# Patient Record
Sex: Male | Born: 1970 | Race: White | Hispanic: No | Marital: Single | Smoking: Never smoker
Health system: Southern US, Community
[De-identification: ages and names within clinical notes are randomized; demographics above are authoritative.]

## PROBLEM LIST (undated history)

## (undated) DIAGNOSIS — M25551 Pain in right hip: Secondary | ICD-10-CM

## (undated) DIAGNOSIS — E119 Type 2 diabetes mellitus without complications: Secondary | ICD-10-CM

## (undated) DIAGNOSIS — Z87442 Personal history of urinary calculi: Secondary | ICD-10-CM

## (undated) DIAGNOSIS — K219 Gastro-esophageal reflux disease without esophagitis: Secondary | ICD-10-CM

## (undated) DIAGNOSIS — G8929 Other chronic pain: Secondary | ICD-10-CM

## (undated) DIAGNOSIS — F419 Anxiety disorder, unspecified: Secondary | ICD-10-CM

## (undated) HISTORY — PX: COLONOSCOPY: SHX174

## (undated) HISTORY — PX: NASAL SINUS SURGERY: SHX719

---

## 2001-03-31 ENCOUNTER — Encounter (INDEPENDENT_AMBULATORY_CARE_PROVIDER_SITE_OTHER): Payer: Self-pay | Admitting: Specialist

## 2001-03-31 ENCOUNTER — Other Ambulatory Visit: Admission: RE | Admit: 2001-03-31 | Discharge: 2001-03-31 | Payer: Self-pay | Admitting: Internal Medicine

## 2001-03-31 ENCOUNTER — Encounter: Payer: Self-pay | Admitting: Internal Medicine

## 2002-09-09 HISTORY — PX: NASAL SINUS SURGERY: SHX719

## 2003-05-28 ENCOUNTER — Encounter: Payer: Self-pay | Admitting: Family Medicine

## 2003-05-28 ENCOUNTER — Ambulatory Visit (HOSPITAL_COMMUNITY): Admission: RE | Admit: 2003-05-28 | Discharge: 2003-05-28 | Payer: Self-pay | Admitting: Family Medicine

## 2003-06-23 ENCOUNTER — Ambulatory Visit (HOSPITAL_BASED_OUTPATIENT_CLINIC_OR_DEPARTMENT_OTHER): Admission: RE | Admit: 2003-06-23 | Discharge: 2003-06-23 | Payer: Self-pay | Admitting: Otolaryngology

## 2003-06-23 ENCOUNTER — Ambulatory Visit (HOSPITAL_COMMUNITY): Admission: RE | Admit: 2003-06-23 | Discharge: 2003-06-23 | Payer: Self-pay | Admitting: Otolaryngology

## 2003-06-23 ENCOUNTER — Encounter (INDEPENDENT_AMBULATORY_CARE_PROVIDER_SITE_OTHER): Payer: Self-pay | Admitting: *Deleted

## 2005-01-16 ENCOUNTER — Ambulatory Visit: Payer: Self-pay | Admitting: Internal Medicine

## 2005-01-29 ENCOUNTER — Ambulatory Visit: Payer: Self-pay | Admitting: Internal Medicine

## 2005-01-29 ENCOUNTER — Encounter (INDEPENDENT_AMBULATORY_CARE_PROVIDER_SITE_OTHER): Payer: Self-pay | Admitting: Specialist

## 2008-09-09 HISTORY — PX: HERNIA REPAIR: SHX51

## 2009-11-16 ENCOUNTER — Ambulatory Visit (HOSPITAL_COMMUNITY): Admission: RE | Admit: 2009-11-16 | Discharge: 2009-11-16 | Payer: Self-pay | Admitting: Surgery

## 2010-10-22 ENCOUNTER — Telehealth: Payer: Self-pay | Admitting: Internal Medicine

## 2010-10-25 ENCOUNTER — Ambulatory Visit (INDEPENDENT_AMBULATORY_CARE_PROVIDER_SITE_OTHER): Payer: 59 | Admitting: Nurse Practitioner

## 2010-10-25 ENCOUNTER — Encounter: Payer: Self-pay | Admitting: Nurse Practitioner

## 2010-10-25 DIAGNOSIS — K625 Hemorrhage of anus and rectum: Secondary | ICD-10-CM | POA: Insufficient documentation

## 2010-10-25 DIAGNOSIS — K5190 Ulcerative colitis, unspecified, without complications: Secondary | ICD-10-CM | POA: Insufficient documentation

## 2010-10-25 DIAGNOSIS — K648 Other hemorrhoids: Secondary | ICD-10-CM | POA: Insufficient documentation

## 2010-10-25 DIAGNOSIS — K5289 Other specified noninfective gastroenteritis and colitis: Secondary | ICD-10-CM

## 2010-10-25 DIAGNOSIS — K644 Residual hemorrhoidal skin tags: Secondary | ICD-10-CM | POA: Insufficient documentation

## 2010-10-25 DIAGNOSIS — K6289 Other specified diseases of anus and rectum: Secondary | ICD-10-CM | POA: Insufficient documentation

## 2010-10-31 NOTE — Progress Notes (Signed)
Summary: Tra[place]: Rectal bleeding/pain   Phone Note Call from Patient Call back at [phone]   Caller: Patient Call For: Dr. Marina Goodell Reason for Call: Talk to Nurse Summary of Call: Pt is having problems with his rectum, having some bleeding, thinks it could be hemorrhoids or his colitis Initial call taken by: [place] Houseworth,  October 22, 2010 11:25 AM  Follow-up for Phone Call        Spoke with patient and he states he is having a little bit of bleeding from his rectum and some discomfort. States he would like to be seen this week. Offered appointment with Willette Cluster, RNP for this week. Patient will see Willette Cluster, RNP 10/25/10 @9am . Follow-up by: Selinda Michaels RN,  October 22, 2010 1:43 PM

## 2010-10-31 NOTE — Procedures (Signed)
Summary: Colon  Patient Name: Jerry Garcia, Jerry Garcia MRN:  Procedure Procedures: Colonoscopy CPT: [phone].    with biopsy. CPT: [id number].  Personnel: Endoscopist: Docia Chuck. Henrene Pastor, MD.  Referred By: Bonne Dolores, MD.  Exam Location: Exam performed in Outpatient Clinic. Outpatient  Patient Consent: Procedure, Alternatives, Risks and Benefits discussed, consent obtained, from patient.  Indications Symptoms: Diarrhea Hematochezia. Abdominal pain / bloating.  Surveillance of: Ulcerative Colitis.  History  Pre-Exam Physical: Performed Mar 31, 2001. Cardio-pulmonary exam, Rectal exam, HEENT exam , Abdominal exam, Extremity exam, Neurological exam, Mental status exam WNL.  Exam Exam: Extent of exam reached: Ileum, extent intended: Ileum.  The cecum was identified by appendiceal orifice and IC valve. Patient position: left side to back. Colon retroflexion performed. Images taken. ASA Classification: II. Tolerance: excellent.  Monitoring: Pulse and BP monitoring, Oximetry used. Supplemental O2 given.  Colon Prep Used Golytely for colon prep. Prep results: excellent.  Sedation Meds: Patient assessed and found to be appropriate for moderate (conscious) sedation. Fentanyl 100 mcg. Versed 10 mg.  Findings NORMAL EXAM: Transverse Colon. Comments: bx'ed.  - MUCOSAL ABNORMALITY: Ascending Colon. Biopsy/Mucosal Abn. taken. Comments: Question mild colitis (focal).  ULCERATIVE COLITIS: Rectum. Granularity present, Proctitis present, taken. 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 Comments: mild.   Assessment Abnormal examination, see findings above.  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   Events  Unplanned Interventions: No intervention was required.  Unplanned Events: There were no complications. Plans Medication Plan: Await pathology.  Comments: Mesalamine 543m supp BID Disposition: After procedure patient sent to recovery. After recovery patient sent home.    Scheduling/Referral: Await pathology to schedule patient. Office Visit, to [employer] PHenrene Pastor MD, in about 4 weeks.,   CC:  MBonne Dolores MD This report was created from the original endoscopy report, which was reviewed and signed by the above listed endoscopist.

## 2010-10-31 NOTE — Procedures (Signed)
Summary: Colon  Patient Name: Jerry Garcia, Jerry Garcia MRN:  Procedure Procedures: Colonoscopy CPT: [phone].    with biopsy. CPT: [id number].  Personnel: Endoscopist: Wilhemina Bonito. Marina Goodell, MD.  Exam Location: Exam performed in Outpatient Clinic. Outpatient  Patient Consent: Procedure, Alternatives, Risks and Benefits discussed, consent obtained, from patient. Consent was obtained by the RN.  Indications Symptoms: Hematochezia.  Surveillance of: Ulcerative Colitis. Last exam: Jul, 2002.  History  Current Medications: Patient is not currently taking Coumadin.  Pre-Exam Physical: Performed Jan 29, 2005. Entire physical exam was normal.  Exam Exam: Extent of exam reached: Terminal Ileum, extent intended: Terminal Ileum.  The cecum was identified by appendiceal orifice and IC valve. Patient position: on left side. Colon retroflexion performed. Images taken. ASA Classification: I. Tolerance: excellent.  Monitoring: Pulse and BP monitoring, Oximetry used. Supplemental O2 given.  Colon Prep Used MIRALAX for colon prep. Prep results: excellent.  Sedation Meds: Patient assessed and found to be appropriate for moderate (conscious) sedation. Fentanyl 150 mcg. given IV. Versed 14 given IV.  Findings NORMAL EXAM: Cecum to Rectum. Biopsy/Normal Exam taken. Comments: BX TRANSVERSE COLON AND RECTUM TAKEN.  MUCOSAL ABNORMALITY: Cecum. Granularity present, Friability: mild. Biopsy/Mucosal Abn. taken. ICD9: Colitis, Unspecified: 558.9. Comments: MILD FOCAL COLITIS ON ICV.  NORMAL EXAM: Ileum.   Assessment  Diagnoses: 558.9: Colitis, Unspecified.   Comments: NO SIGNIFICANT ACTIVE DISEASE Events  Unplanned Interventions: No intervention was required.  Unplanned Events: There were no complications. Plans Medication Plan: 5-ASA: Mesalamine Suppository prn,   Disposition: After procedure patient sent to recovery. After recovery patient sent home.  Scheduling/Referral: Follow-Up prn.   CC:   Loran Senters, MD This report was created from the original endoscopy report, which was reviewed and signed by the above listed endoscopist.

## 2010-10-31 NOTE — Assessment & Plan Note (Addendum)
Summary: Rectal bleeding     History of Present Illness Visit Type: new patient  Primary GI MD: Scarlette Shorts MD Primary Provider: Rosemary Holms, MD  Requesting Provider: na Chief Complaint: Pt c/o BRB when he wipes after BMs and in stools, constipation, and rectal pain   History of Present Illness:   Patient is followed by Dr. Henrene Pastor for history of ulcerative colitis. He was last seen here in 2006 at time of his colonoscopy which revealed only mild focal colitis at ileocecal valve. Folllowing that, patient was advised to follow up with Dr. Henrene Pastor on an as needed basis.  Uses Canasa suppositories on an as need basis which is infrequent.  For last three days he has had some bleeding and pain with defecation. Complains of a bulge in rectum. He has been constipated for three weeks. Started Canasa suppositories 3 days ago. This doesn't really feel like a flare, he has no abdominal pain.   GI Review of Systems    Reports acid reflux and  bloating.      Denies abdominal pain, belching, chest pain, dysphagia with liquids, dysphagia with solids, heartburn, loss of appetite, nausea, vomiting, vomiting blood, weight loss, and  weight gain.      Reports constipation, hemorrhoids, rectal bleeding, and  rectal pain.     Denies anal fissure, black tarry stools, change in bowel habit, diarrhea, diverticulosis, fecal incontinence, heme positive stool, irritable bowel syndrome, jaundice, light color stool, and  liver problems.   Current Medications (verified): 1)  Canasa 1000 Mg Supp (Mesalamine) .Marland Kitchen.. 1 Supp in Rectum At Bedtime  Allergies (verified): 1)  ! Percocet  Past History:  Past Medical History: Ulcerative colitis  Past Surgical History: Hernia Surgery Sinus Surgery   Family History: No FH of Colon Cancer:  Social History: Mail Clerk  Single No childern Patient has never smoked.  Alcohol Use - no Daily Caffeine Use: 2 daily  Smoking Status:  never  Review of Systems  The patient  denies allergy/sinus, anemia, anxiety-new, arthritis/joint pain, back pain, blood in urine, breast changes/lumps, change in vision, confusion, cough, coughing up blood, depression-new, fainting, fatigue, fever, headaches-new, hearing problems, heart murmur, heart rhythm changes, itching, menstrual pain, muscle pains/cramps, night sweats, nosebleeds, pregnancy symptoms, shortness of breath, skin rash, sleeping problems, sore throat, swelling of feet/legs, swollen lymph glands, thirst - excessive , urination - excessive , urination changes/pain, urine leakage, vision changes, and voice change.    Vital Signs:  Patient profile:   40 year old male Height:      70 inches Weight:      210 pounds BMI:     30.24 BSA:     2.13 Temp:     98.6 degrees F oral Pulse rate:   88 / minute Pulse rhythm:   regular BP sitting:   132 / 74  (left arm) Cuff size:   regular  Vitals Entered By: Hope Pigeon CMA (October 25, 2010 8:45 AM)  Physical Exam  General:  Well developed, well nourished, no acute distress. Head:  Normocephalic and atraumatic. Eyes:  Conjunctiva pink, no icterus.  Neck:  no obvious masses  Lungs:  Clear throughout to auscultation. Heart:  Regular rate and rhythm; no murmurs, rubs,  or bruits. Abdomen:  Abdomen soft, nontender, nondistended. No obvious masses or hepatomegaly.Normal bowel sounds.  Rectal:  Questionable small  posterior midline fissure (not really tender)Small inflamed internal hemorrhoids on anoscopy. Stool light brown, heme negative.  Msk:  Symmetrical with no gross deformities. Normal posture.  Extremities:  No palmar erythema, no edema.  Neurologic:  Alert and  oriented x4;  grossly normal neurologically. Skin:  Intact without significant lesions or rashes. Cervical Nodes:  No significant cervical adenopathy. Psych:  Alert and cooperative. Normal mood and affect.  Impression & Recommendations:  Problem # 1:  RECTAL BLEEDING (ICD-569.3) Assessment New Cannot  exclude UC flare but I favor symptoms secondary to small fissure and/ or internal hemorroids, especially given recent constipation. Stop Canasa suppositories and start steroid suppositories. Will start him on daily fiber. Patient will call us in 7-10 days with condition update. Further recommendations based on how he does.   Problem # 2:  COLITIS (ICD-558.9) Assessment: Comment Only History of ulcerative colitis 2002 colonoscopy remarkable for ascending colon colitis and proctitis. His last colonoscopy in 2006 however,  showed only mild focal colitis at ileocecal valve (biopsies compatible with mild active chronic mucosal colitis).  Patient doesn't take any maintainence medications, just Canasa suppositories as needed.  Patient Instructions: 1)  We have given you samples of Metamucil fiber to take daily. 2)  Start Miralax daily, 17 grams in a glass of water or juice. 3)  Discontinue the Canasa suppositories for now. 4)  Call us in 1 week if you are not better.  Ask for Pam at [phone].  5)  Copy sent to : Dr. Rosemary Holms 6)  The medication list was reviewed and reconciled.  All changed / newly prescribed medications were explained.  A complete medication list was provided to the patient / caregiver.  Prescriptions: HYDROCORTISONE ACETATE 25 MG SUPP (HYDROCORTISONE ACETATE) Use 1 suppository rectally for hemorrhoids  #7 x 0   Entered by:   Marisue Humble NCMA   Authorized by:   Tye Savoy NP   Signed by:   Marisue Humble NCMA on 10/25/2010   Method used:   Print then Give to Patient   RxID:   [id number]

## 2011-01-25 NOTE — H&P (Signed)
NAME:  Jerry Garcia, COIA                        ACCOUNT NO.:  [id number]   MEDICAL RECORD NO.:  [id number]                   PATIENT TYPE:  AMB   LOCATION:  [place]                                  FACILITY:  [facility]   PHYSICIAN:  Minna Merritts, M.D.                DATE OF BIRTH:  February 11, 1971   DATE OF ADMISSION:  06/23/2003  DATE OF DISCHARGE:                                HISTORY & PHYSICAL   HISTORY OF PRESENT ILLNESS:  This patient is a 40 year old male who has a  history of nasal and septal deviation.  He has had difficulty breathing  through his nose on several occasions.  Has had also sinusitis problems and  has been on Pro lax D, using Augmentin, Z-pack, and doxycycline in the past  and has used nasal sprays.  I was very concerned about his sinuses, was  concerning the number of sinus infections he has and we got a CAT scan that  showed only some acute activity of his left maxillary sinus but it showed a  septal deviation.  He now enters for a septal reconstruction to see if we  can improve his airway and also improve his record for sinus infections.   ALLERGIES:  NO KNOWN DRUG ALLERGIES   MEDICATIONS:  He takes no medications, except as mentioned.   PAST MEDICAL HISTORY:  1. Colitis.  2. Reflux esophagitis and takes Nexium 40 mg p.r.n.   PAST SURGICAL HISTORY:  He has never had any other surgeries.   PHYSICAL EXAMINATION:  VITAL SIGNS:  Blood pressure of 123/82, weight 193,  height 5 feet 10 inches.  HEENT:  His ears are clear, oral cavity is clear.  His septum is deviated to  the right with a spur to the right just off the premaxillary crest to the  left.  His columella is pushed way over to the right, blocking the right  nasal airway, essentially about 80%.  The larynx is clear.  True cords,  false cords, epiglottis, base of tongue are clear of any ulceration or mass.  NECK:  His neck is free of any thyromegaly, cervical adenopathy or mass.  CHEST:  Clear, no wheezing,  rhonchi or rales.  CARDIOVASCULAR:  No mixed sounds, murmurs or gallops.  ABDOMEN:  Free of any organomegaly, tenderness or mass.  EXTREMITIES:  Unremarkable.    </INITIAL DIAGNOSES>  1. 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  2. History of reflux esophagitis.  3. Colitis.                                                Minna Merritts, M.D.    JC/MEDQ  D:  06/23/2003  T:  06/23/2003  Job:  [id number]   cc:   Margaretmary Eddy, M.D.  [place] [address]  Fax: [phone]

## 2011-01-25 NOTE — Op Note (Signed)
NAME:  Jerry Garcia, Jerry Garcia                        ACCOUNT NO.:  [id number]   MEDICAL RECORD NO.:  [id number]                   PATIENT TYPE:  AMB   LOCATION:  [place]                                  FACILITY:  [facility]   PHYSICIAN:  Minna Merritts, M.D.                DATE OF BIRTH:  [birth year]   DATE OF PROCEDURE:  06/23/2003  DATE OF DISCHARGE:                                 OPERATIVE REPORT   PREOPERATIVE DIAGNOSIS:  Septal deviation, turbinate hypertrophy with nasal  obstruction with history of sinusitis.   POSTOPERATIVE DIAGNOSIS:  Septal deviation, turbinate hypertrophy with nasal  obstruction with history of sinusitis.   OPERATION PERFORMED:  Septal reconstruction, turbinate reduction.   SURGEON:  Minna Merritts, M.D.   ANESTHESIA:  Local 1% Xylocaine with epinephrine 53m with topical cocaine  200 mg.   DESCRIPTION OF PROCEDURE:  The patient was placed in supine position, under  local anesthesia was prepped and draped in the appropriate manner using  Hibiclens times three and the usual head drape and then the patient was  anesthetized with the above mentioned medication.  A hemitransfixion  incision was made on the left side, carried around the columella, pushing  the columella to the left and working on the right side.  We worked back  along the quadrilateral cartilage to the posterior quadrilateral cartilage  where we took a strip of cartilage there. We took a strip of cartilage from  the floor of the nose as it was grossly off the premaxillary crest and then  we approached the ethmoid septal deviation which was pushed also over toward  the right.  We corrected this by using the open and closed JRanelle Oyster  Middletons, also a large spur was removed from the vomerine region and this  was done with the [facility] Centerforceps.  We then made an incision on the floor  of the nose at the left side and pulled a portion of this quadrilateral  cartilage that was off the premaxillary crest.   Once this was achieved, the  septum was established in the midline.  We had to mobilize around the  columella to get it to come over to the midline.  We outfractured the  inferior turbinates to get as much space as possible and then we closed this  with 5-0 plain cat.  We then used through-and-through septal suture using 4-  0 plain times two and the intranasal dressing was placed using Merocel  packs.  Patient is aware that he will not travel or do heavy lifting for  approximately 10 days.  He has to be very careful at home about any exercise  causing bleeding and he will be returning to my office tomorrow and follow-  up will be in 10 days and then three weeks and six weeks.  Minna Merritts, M.D.    JC/MEDQ  D:  06/23/2003  T:  06/23/2003  Job:  [id number]   cc:   Margaretmary Eddy, M.D.  [address] [place] [address]  Fax: [phone]

## 2011-06-05 ENCOUNTER — Telehealth: Payer: Self-pay | Admitting: Internal Medicine

## 2011-06-05 MED ORDER — HYDROCORTISONE 2.5 % RE CREA: TOPICAL_CREAM | RECTAL | Status: DC

## 2011-06-05 NOTE — Telephone Encounter (Signed)
Proctocream HC 2.5% apply 2-3 times a day #30 grams and no refill  He will need an office follow-up by October to make sure nothing else going on given hx ?proctitis, etc

## 2011-06-05 NOTE — Telephone Encounter (Signed)
Pt states that he has an external hemorrhoid about the size of a grape that has been bothering him since Sunday. He states that he has been taking miralax and metamucil and soaking BID, he states that there was a little bleeding from it yesterday. Pt is requesting something for the hemorrhoid. Dr. Carlean Purl as doc of the day please advise.

## 2011-06-05 NOTE — Telephone Encounter (Signed)
Pt aware and script sent to pharmacy. Scheduled pt to see Dr. Marina Goodell 07/01/11@8 :30am. Pt aware of appt date and time.

## 2011-07-01 ENCOUNTER — Ambulatory Visit (INDEPENDENT_AMBULATORY_CARE_PROVIDER_SITE_OTHER): Payer: 59 | Admitting: Internal Medicine

## 2011-07-01 ENCOUNTER — Encounter: Payer: Self-pay | Admitting: Internal Medicine

## 2011-07-01 VITALS — BP 124/86 | HR 72 | Ht 70.0 in | Wt 214.2 lb

## 2011-07-01 DIAGNOSIS — K645 Perianal venous thrombosis: Secondary | ICD-10-CM

## 2011-07-01 DIAGNOSIS — K529 Noninfective gastroenteritis and colitis, unspecified: Secondary | ICD-10-CM

## 2011-07-01 DIAGNOSIS — K5289 Other specified noninfective gastroenteritis and colitis: Secondary | ICD-10-CM

## 2011-07-01 NOTE — Progress Notes (Signed)
HISTORY OF PRESENT ILLNESS:  Jerry Garcia is a 40 y.o. male with a history of mild intermittent ascending colitis and proctitis. I have not seen him since 2006 when he underwent complete colonoscopy for rectal bleeding. He was found to have minimal focal colitis on the ileocecal valve. Otherwise normal exam. Normal ileum. He recently contacted the office complaining of a probable hemorrhoid. He describes a protrusion the size of a grape. This was tender with minimal bleeding. This was treated with sitz baths and hydrocortisone cream. He has had significant improvement. Otherwise, no GI complaints. Specifically no change in bowel habits, blood in his stool, abdominal pain, or weight loss. General medical health is stable.  REVIEW OF SYSTEMS:  All non-GI ROS negative.   Past Medical History  Diagnosis Date  . Ulcerative colitis     Past Surgical History  Procedure Date  . Hernia repair   . Nasal sinus surgery     Social History Jerry Garcia  reports that he has never smoked. He has never used smokeless tobacco. He reports that he does not drink alcohol or use illicit drugs.  family history is negative for Colon cancer.  Allergies  Allergen Reactions  . Oxycodone-Acetaminophen        PHYSICAL EXAMINATION: Vital signs: BP 124/86  Pulse 72  Ht 5' 10"  (1.778 m)  Wt 214 lb 3.2 oz (97.16 kg)  BMI 30.73 kg/m2  Constitutional: generally well-appearing, no acute distress Psychiatric: alert and oriented x3, cooperative Eyes: extraocular movements intact, anicteric, conjunctiva pink Cardiovascular: heart regular rate and rhythm,  Abdomen: soft, Rectal: Left semi-thrombosed hemorrhoid (improving) Extremities: no lower extremity edema bilaterally Skin: no lesions on visible extremities   ASSESSMENT:  #1. Thrombosed hemorrhoid. Improving #2. Minimal A. ascending colitis and proctitis, intermittently. Currently asymptomatic. Previous colonoscopies in 2002 and again in  2006   PLAN:  #1. Continue fiber #2. Continue hydrocortisone cream until completely better #3. Continue sitz baths and to completely better #4. GI followup when necessary. Routine colonoscopy at age 74

## 2011-07-01 NOTE — Patient Instructions (Signed)
Follow up as needed

## 2012-08-12 ENCOUNTER — Telehealth: Payer: Self-pay | Admitting: Internal Medicine

## 2012-08-12 MED ORDER — MESALAMINE 1000 MG RE SUPP: 1000.0000 mg | Freq: Every day | RECTAL | Status: DC

## 2012-08-12 NOTE — Telephone Encounter (Signed)
Told patient I had sent his Canasa refill to his pharmacy and would leave one box of samples up front for him; patient acknowledged and agreed

## 2013-01-29 ENCOUNTER — Encounter (HOSPITAL_COMMUNITY): Payer: Self-pay | Admitting: Emergency Medicine

## 2013-01-29 ENCOUNTER — Emergency Department (INDEPENDENT_AMBULATORY_CARE_PROVIDER_SITE_OTHER)
Admission: EM | Admit: 2013-01-29 | Discharge: 2013-01-29 | Disposition: A | Discharge status: Home / Self Care | Payer: 59 | Source: Home / Self Care

## 2013-01-29 DIAGNOSIS — M79672 Pain in left foot: Secondary | ICD-10-CM

## 2013-01-29 DIAGNOSIS — M79609 Pain in unspecified limb: Secondary | ICD-10-CM

## 2013-01-29 DIAGNOSIS — R071 Chest pain on breathing: Secondary | ICD-10-CM

## 2013-01-29 DIAGNOSIS — R0789 Other chest pain: Secondary | ICD-10-CM

## 2013-01-29 NOTE — ED Notes (Signed)
Pt c/o chest discomfort onset 3 weeks... Reports pain on right side of chest when raises arms Denies: SOB, HA, edema, inj/trauma... Does report he lifts boxes at work that weigh 60-70lbs Also c/o toes burning on bilateral feet onset 1 week Denies: blurry vision, urinary freq, but has the urgency  He is alert and oriented w/no signs of acute distress.

## 2013-01-29 NOTE — ED Provider Notes (Signed)
Medical screening examination/treatment/procedure(s) were performed by non-physician practitioner and as supervising physician I was immediately available for consultation/collaboration.  Leslee Home, M.D.  Reuben Likes, MD 01/29/13 2031

## 2013-01-29 NOTE — ED Provider Notes (Signed)
History     CSN: [id number]  Arrival date & time 01/29/13  1731   None     Chief Complaint  Patient presents with  . Chest Pain    (Consider location/radiation/quality/duration/timing/severity/associated sxs/prior treatment) HPI Comments: Patient also complains of bilateral burning sensation of the toes. This began approximately 2 weeks ago and had been intermittent until approximately 2 days ago when the burning has worsened. There is no associated trauma. Has no history of known metabolic disorder may calls neuropathic type pain. He wears steel toed boots with prolonged standing. Recently he has tried changing shoes. He takes his shoes off the burning pain tends to disappear. Applying any shoe the burning returns.  Patient is a 42 y.o. male presenting with chest pain. The history is provided by the patient.  Chest Pain Pain location:  R chest and R lateral chest Pain quality: aching and shooting   Pain radiates to:  Does not radiate Pain radiates to the back: no   Pain severity:  Mild Onset quality:  Sudden Duration:  21 days Timing:  Intermittent Progression:  Partially resolved Chronicity:  New Context: breathing, lifting, movement, raising an arm and at rest   Context: no trauma   Relieved by:  Rest Worsened by:  Exertion, movement, deep breathing and certain positions Associated symptoms: no abdominal pain, no altered mental status, no anxiety, no back pain, no cough, no dizziness, no dysphagia, no fever, no heartburn, no lower extremity edema, no nausea, no near-syncope, no orthopnea, no palpitations, no shortness of breath, no syncope, not vomiting and no weakness   Risk factors: male sex   Risk factors: no diabetes mellitus     Past Medical History  Diagnosis Date  . Ulcerative colitis     Past Surgical History  Procedure Laterality Date  . Hernia repair    . Nasal sinus surgery      Family History  Problem Relation Age of Onset  . Colon cancer Neg Hx      History  Substance Use Topics  . Smoking status: Never Smoker   . Smokeless tobacco: Never Used  . Alcohol Use: No      Review of Systems  Constitutional: Negative for fever.  HENT: Negative.  Negative for trouble swallowing.   Respiratory: Negative for cough and shortness of breath.   Cardiovascular: Positive for chest pain. Negative for palpitations, orthopnea, leg swelling, syncope and near-syncope.  Gastrointestinal: Negative for heartburn, nausea, vomiting and abdominal pain.  Genitourinary: Negative.   Musculoskeletal: Negative for back pain.  Neurological: Negative for dizziness and weakness.  Psychiatric/Behavioral: Negative for altered mental status.    Allergies  Oxycodone-acetaminophen  Home Medications   Current Outpatient Rx  Name  Route  Sig  Dispense  Refill  . hydrocortisone (ANUSOL-HC) 2.5 % rectal cream      Apply to rectum 2-3 times per day.   30 g   0   . mesalamine (CANASA) 1000 MG suppository   Rectal   Place 1 suppository (1,000 mg total) rectally at bedtime.   30 suppository   2     BP 133/86  Pulse 100  Temp(Src) 99.2 F (37.3 C) (Oral)  Resp 16  SpO2 100%  Physical Exam  Nursing note and vitals reviewed. Constitutional: He is oriented to person, place, and time. He appears well-developed and well-nourished. No distress.  HENT:  Head: Normocephalic and atraumatic.  Eyes: EOM are normal. Left eye exhibits no discharge.  Neck: Normal range of motion. Neck supple.  Cardiovascular: Normal rate, regular rhythm and normal heart sounds.   Pulmonary/Chest: Effort normal and breath sounds normal. No respiratory distress. He has no wheezes. He exhibits tenderness.  Musculoskeletal: He exhibits tenderness.  Neurological: He is alert and oriented to person, place, and time. No cranial nerve deficit.  Skin: Skin is warm and dry.  Psychiatric: He has a normal mood and affect.    ED Course  Procedures (including critical care  time)  Labs Reviewed - No data to display No results found.   1. Right-sided chest wall pain   2. Foot pain, bilateral       MDM  Continued the ibuprofen when necessary pain. Recommend putting ice over the area of soreness. The burning toes are most likely due to his footwear with steel toed boots and prolonged standing. Less likely this is a neuropathy due to a metabolic disorder. Recommend wearing a more air he shoe, lie in plenty of forefoot width. He Followup with primary care doctor as needed. For any new symptoms problems or worsening may return. Patient is discharged in stable condition.  Hayden Rasmussen, NP 01/29/13 1858

## 2013-04-15 ENCOUNTER — Telehealth: Payer: Self-pay | Admitting: Internal Medicine

## 2013-04-25 NOTE — Telephone Encounter (Signed)
Please get some clinical history. Thanks

## 2013-04-26 NOTE — Telephone Encounter (Signed)
Pt states that he has been having some rectal pain and a little bit of bleeding. States he has used some of his canasa supp and it was feeling better until he worked outside yesterday. States he is having more pain today. Pt thinks he should be seen. Offered pt an appt for today but he could not come today. Requested an appt for Wed. Pt scheduled to see Doug Sou PA 04/28/13@11am . Pt aware of appt.

## 2013-04-27 ENCOUNTER — Telehealth: Payer: Self-pay

## 2013-04-27 MED ORDER — HYDROCORTISONE 2.5 % RE CREA: TOPICAL_CREAM | RECTAL | Status: DC

## 2013-04-27 NOTE — Telephone Encounter (Signed)
Refilled Anusol cream

## 2013-04-28 ENCOUNTER — Other Ambulatory Visit (INDEPENDENT_AMBULATORY_CARE_PROVIDER_SITE_OTHER): Payer: 59

## 2013-04-28 ENCOUNTER — Encounter: Payer: Self-pay | Admitting: Gastroenterology

## 2013-04-28 ENCOUNTER — Ambulatory Visit (INDEPENDENT_AMBULATORY_CARE_PROVIDER_SITE_OTHER): Payer: 59 | Admitting: Gastroenterology

## 2013-04-28 ENCOUNTER — Ambulatory Visit (INDEPENDENT_AMBULATORY_CARE_PROVIDER_SITE_OTHER)
Admission: RE | Admit: 2013-04-28 | Discharge: 2013-04-28 | Disposition: A | Discharge status: Home / Self Care | Payer: 59 | Source: Ambulatory Visit | Attending: Gastroenterology | Admitting: Gastroenterology

## 2013-04-28 VITALS — BP 122/80 | HR 97 | Ht 70.0 in | Wt 200.6 lb

## 2013-04-28 DIAGNOSIS — G8929 Other chronic pain: Secondary | ICD-10-CM

## 2013-04-28 DIAGNOSIS — Z8719 Personal history of other diseases of the digestive system: Secondary | ICD-10-CM

## 2013-04-28 DIAGNOSIS — R1031 Right lower quadrant pain: Secondary | ICD-10-CM

## 2013-04-28 DIAGNOSIS — K625 Hemorrhage of anus and rectum: Secondary | ICD-10-CM

## 2013-04-28 LAB — CBC WITH DIFFERENTIAL/PLATELET
Basophils Absolute: 0 10*3/uL (ref 0.0–0.1)
Eosinophils Absolute: 0.2 10*3/uL (ref 0.0–0.7)
Eosinophils Relative: 2.2 % (ref 0.0–5.0)
HCT: 44.5 % (ref 39.0–52.0)
Lymphs Abs: 1.3 10*3/uL (ref 0.7–4.0)
MCHC: 34.3 g/dL (ref 30.0–36.0)
MCV: 86.8 fl (ref 78.0–100.0)
Monocytes Absolute: 0.5 10*3/uL (ref 0.1–1.0)
Neutrophils Relative %: 71.7 % (ref 43.0–77.0)
Platelets: 361 10*3/uL (ref 150.0–400.0)
RDW: 13.7 % (ref 11.5–14.6)

## 2013-04-28 LAB — COMPREHENSIVE METABOLIC PANEL
AST: 23 U/L (ref 0–37)
Alkaline Phosphatase: 61 U/L (ref 39–117)
Glucose, Bld: 99 mg/dL (ref 70–99)
Potassium: 4.2 mEq/L (ref 3.5–5.1)
Sodium: 135 mEq/L (ref 135–145)
Total Bilirubin: 0.6 mg/dL (ref 0.3–1.2)
Total Protein: 7.8 g/dL (ref 6.0–8.3)

## 2013-04-28 LAB — HIGH SENSITIVITY CRP: CRP, High Sensitivity: 3.66 mg/L (ref 0.000–5.000)

## 2013-04-28 MED ORDER — IOHEXOL 300 MG/ML  SOLN
100.0000 mL | Freq: Once | INTRAMUSCULAR | Status: AC | PRN
  Administered 2013-04-28: 100 mL via INTRAVENOUS

## 2013-04-28 NOTE — Progress Notes (Signed)
Case discussed with physician assistant. Agree with initial assessment and plans as outlined.

## 2013-04-28 NOTE — Patient Instructions (Addendum)
Please go to the basement level to have your labs drawn.   You have been scheduled for a CT scan of the abdomen and pelvis at [facility] ([address]---this is in the same building as [facility]).   You are scheduled on 04-28-2013 Wednesday. You should arrive at 3:45 PM  prior to your appointment time for registration. Please follow the written instructions below on the day of your exam:  WARNING: IF YOU ARE ALLERGIC TO IODINE/X-RAY DYE, PLEASE NOTIFY RADIOLOGY IMMEDIATELY AT [phone]! YOU WILL BE GIVEN A 13 HOUR PREMEDICATION PREP.  1) Do not eat or drink anything after 11:00 am  (4 hours prior to your test) 2) You have been given 2 bottles of oral contrast to drink. The solution may taste better if refrigerated, but do NOT add ice or any other liquid to this solution. Shake well before drinking.    Drink 1 bottle of contrast @ 2:00 (2 hours prior to your exam)  Drink 1 bottle of contrast @ 3:00 PM  (1 hour prior to your exam)  You may take any medications as prescribed with a small amount of water except for the following: Metformin, Glucophage, Glucovance, Avandamet, Riomet, Fortamet, Actoplus Met, Janumet, Glumetza or Metaglip. The above medications must be held the day of the exam AND 48 hours after the exam.  The purpose of you drinking the oral contrast is to aid in the visualization of your intestinal tract. The contrast solution may cause some diarrhea. Before your exam is started, you will be given a small amount of fluid to drink. Depending on your individual set of symptoms, you may also receive an intravenous injection of x-ray contrast/dye. Plan on being at [facility] for 30 minutes or long, depending on the type of exam you are having performed.  If you have any questions regarding your exam or if you need to reschedule, you may call the CT department at [phone] between the hours of 8:00 am and 5:00 pm,  Monday-Friday.  ________________________________________________________________________

## 2013-04-28 NOTE — Progress Notes (Signed)
04/28/2013 Jerry Garcia [id number] 10-Nov-1970   History of Present Illness:  Patient is a 42 year old male who is a patient of Dr. Lamar Sprinkles.  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  He has never wanted to take maintenance medication for this, however, and only uses Canasa suppositories prn.  Last colonoscopy in 2006 actually showed minimal focal colitis at the [facility].    Anyway, he comes in today with complaints of abdominal pain and pelvic discomfort along with pain in his lower back over the past month or so.  Also change in bowels with alternating constipation and diarrhea.  Takes Metamucil every day.  Also, said that he had a deep pulling sensation in his rectum as well with a small amount of bleeding.  He started using the Canasa suppositories again and the rectal complaints have improved, but the other symptoms persist.  He denies nausea, vomiting, fevers, chills, decreased appetite, and weight loss.   Current Medications, Allergies, Past Medical History, Past Surgical History, Family History and Social History were reviewed in [employer] record.   Physical Exam: BP 122/80  Pulse 97  Ht 5\' 10"  (1.778 m)  Wt 200 lb 9.6 oz (90.992 kg)  BMI 28.78 kg/m2  SpO2 98% General: Well developed, white male in no acute distress; appears somewhat anxious. Head: Normocephalic and atraumatic Eyes:  sclerae anicteric, conjunctiva pink  Ears: Normal auditory acuity Lungs: Clear throughout to auscultation Heart: Regular rate and rhythm Abdomen: Soft, non-distended. No masses, no hepatomegaly. Normal bowel sounds.  Mild lower abdominal/pelvic TTP without R/R/G. Rectal: Small external hemorrhoid noted.  No fissure.  DRE did not reveal any masses or tenderness. Musculoskeletal: Symmetrical with no gross deformities  Extremities: No edema  Neurological: Alert oriented x 4, grossly  nonfocal Psychological:  Alert and cooperative. Normal mood and affect; slightly anxious.  Assessment and Recommendations: -42 year old male with past history of non-specific colitis without any chronic treatment regimen now presenting with abdominal pains and rectal pains along with pain in his back/SI joints.  Also with rectal bleeding and some change in bowel habits.  I discussed this patient with Dr. Marina Goodell who agrees to imaging with CT scan of the abdomen and pelvis with contrast along with repeat colonoscopy.  In the interim, continue Canasa suppositories and can used NSAID's intermittently for back pain.  Will check CBC, CMP, sed rate and CRP as well.  The risks, benefits, and alternatives were discussed with the patient and he consents to proceed.

## 2013-04-29 ENCOUNTER — Other Ambulatory Visit: Payer: Self-pay | Admitting: Gastroenterology

## 2013-04-29 MED ORDER — PEG-KCL-NACL-NASULF-NA ASC-C 100 G PO SOLR: 1.0000 | Freq: Once | ORAL | Status: DC

## 2013-04-30 ENCOUNTER — Other Ambulatory Visit: Payer: 59

## 2013-04-30 ENCOUNTER — Encounter: Payer: Self-pay | Admitting: Internal Medicine

## 2013-04-30 ENCOUNTER — Ambulatory Visit (AMBULATORY_SURGERY_CENTER): Payer: 59 | Admitting: Internal Medicine

## 2013-04-30 VITALS — BP 116/69 | HR 83 | Temp 98.0°F | Resp 20 | Ht 70.0 in | Wt 200.0 lb

## 2013-04-30 DIAGNOSIS — R1031 Right lower quadrant pain: Secondary | ICD-10-CM

## 2013-04-30 DIAGNOSIS — K5190 Ulcerative colitis, unspecified, without complications: Secondary | ICD-10-CM

## 2013-04-30 DIAGNOSIS — K625 Hemorrhage of anus and rectum: Secondary | ICD-10-CM

## 2013-04-30 DIAGNOSIS — Z1211 Encounter for screening for malignant neoplasm of colon: Secondary | ICD-10-CM

## 2013-04-30 DIAGNOSIS — G8929 Other chronic pain: Secondary | ICD-10-CM

## 2013-04-30 HISTORY — PX: COLONOSCOPY: SHX174

## 2013-04-30 MED ORDER — SODIUM CHLORIDE 0.9 % IV SOLN: 500.0000 mL | INTRAVENOUS | Status: DC

## 2013-04-30 NOTE — Patient Instructions (Addendum)

## 2013-04-30 NOTE — Op Note (Signed)
[facility] [address] [place], [address]   COLONOSCOPY PROCEDURE REPORT  PATIENT: Jerry, Garcia  MR#: [id number] BIRTHDATE: 09/20/1970 , 42  yrs. old GENDER: Male ENDOSCOPIST: Roxy Cedar, MD REFERRED BY:.  Self / Office PROCEDURE DATE:  04/30/2013 PROCEDURE:   Colonoscopy, screening First Screening Colonoscopy - Avg.  risk and is 50 yrs.  old or older - No.  Prior Negative Screening - Now for repeat screening. N/A  History of Adenoma - Now for follow-up colonoscopy & has been > or = to 3 yrs.  N/A  Polyps Removed Today? No.  Recommend repeat exam, <10 yrs? No. ASA CLASS:   Class II INDICATIONS:High risk patient with previously diagnosed UC. Intermittent proctitis and patchy ascending colitis since '92. Last exam 2006. Some symptoms at recent OV as described. CT - ok MEDICATIONS: MAC sedation, administered by CRNA and propofol (Diprivan) 250mg  IV  DESCRIPTION OF PROCEDURE:   After the risks benefits and alternatives of the procedure were thoroughly explained, informed consent was obtained.  A digital rectal exam revealed no abnormalities of the rectum.   The LB [id number]  endoscope was introduced through the anus and advanced to the cecum, which was identified by both the appendix and ileocecal valve. No adverse events experienced.   The quality of the prep was excellent, using MoviPrep  The instrument was then slowly withdrawn as the colon was fully examined.  COLON FINDINGS: A normal appearing cecum, ileocecal valve, and appendiceal orifice were identified.  The ascending, hepatic flexure, transverse, splenic flexure, descending, sigmoid colon and rectum appeared unremarkable.  No polyps or cancers were seen. Retroflexed views revealed internal hemorrhoids. The time to cecum=2 minutes 19 seconds.  Withdrawal time=6 minutes 56 seconds. The scope was withdrawn and the procedure completed.  COMPLICATIONS: There were no  complications.  ENDOSCOPIC IMPRESSION: 1. Normal colon 2, Quiescent UC  RECOMMENDATIONS: 1. Continue Canasa suppositories as needed 2. Repeat colonoscopy in 10 years.   eSigned:  Roxy Cedar, MD 04/30/2013 3:04 PM   cc: Simone Curia, Md and The Patient   PATIENT NAME:  Jerry, Garcia MR#: [id number]

## 2013-04-30 NOTE — Progress Notes (Signed)
Patient did not experience any of the following events: a burn prior to discharge; a fall within the facility; wrong site/side/patient/procedure/implant event; or a hospital transfer or hospital admission upon discharge from the facility. (G8907) Patient did not have preoperative order for IV antibiotic SSI prophylaxis. (G8918)  

## 2013-04-30 NOTE — Progress Notes (Signed)
Report to pacu RN, vss, bbs=clear 

## 2013-05-03 ENCOUNTER — Telehealth: Payer: Self-pay

## 2013-05-03 NOTE — Telephone Encounter (Signed)
  Follow up Call-  Call back number 04/30/2013  Post procedure Call Back phone  # [phone]  Permission to leave phone message Yes     Patient questions:  Do you have a fever, pain , or abdominal swelling? no Pain Score  0 *  Have you tolerated food without any problems? yes  Have you been able to return to your normal activities? yes  Do you have any questions about your discharge instructions: Diet   no Medications  no Follow up visit  no  Do you have questions or concerns about your Care? no  Actions: * If pain score is 4 or above: No action needed, pain <4.   No problems per the pt . Maw

## 2013-05-04 ENCOUNTER — Telehealth: Payer: Self-pay | Admitting: Family Medicine

## 2013-05-04 NOTE — Telephone Encounter (Signed)
Patient says that Jerry Garcia was supposed to send over his results for colonoscopy and cat scan and he states they seen something wrong with his cat scan. He is wondering if he needs to make an appointment at our office or does he need to be referred somewhere else?

## 2013-05-04 NOTE — Telephone Encounter (Signed)
Scan showed issue with femur, he can follow up here this week. As for the abd ct looked ok

## 2013-05-04 NOTE — Telephone Encounter (Signed)
Notified patient scan showed issue with femur, he can follow up here this week. As for the abd ct looked ok. Patient was transferred to front desk to schedule appt for this week.

## 2013-05-05 ENCOUNTER — Ambulatory Visit (INDEPENDENT_AMBULATORY_CARE_PROVIDER_SITE_OTHER): Payer: 59 | Admitting: Family Medicine

## 2013-05-05 ENCOUNTER — Encounter: Payer: Self-pay | Admitting: Family Medicine

## 2013-05-05 VITALS — BP 140/88 | Ht 70.0 in | Wt 198.8 lb

## 2013-05-05 DIAGNOSIS — M25551 Pain in right hip: Secondary | ICD-10-CM | POA: Insufficient documentation

## 2013-05-05 DIAGNOSIS — M25559 Pain in unspecified hip: Secondary | ICD-10-CM

## 2013-05-05 NOTE — Progress Notes (Signed)
  Subjective:    Patient ID: Jerry Garcia, male    DOB: 11-29-1970, 42 y.o.   MRN: [id number]  HPI Patient here today to go over blood work, CT scan, and coloscopy results.  No other concerns.  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    Objective:   Physical Exam He has good range of motion of the hip is set for external rotation subjective discomfort in the right lower back abdomen is soft       Assessment & Plan:  Hip pain with abnormal CT scan he needs MRI to help nail down the diagnosis I spoke with the radiologist who clearly stated that the CAT scan was not definitive and they recommend MRI. Therefore we will set up the MRI may well need referral to orthopedics

## 2013-05-06 ENCOUNTER — Emergency Department (HOSPITAL_COMMUNITY)
Admission: EM | Admit: 2013-05-06 | Discharge: 2013-05-06 | Disposition: A | Discharge status: Home / Self Care | Payer: 59 | Source: Home / Self Care

## 2013-05-06 ENCOUNTER — Encounter (HOSPITAL_COMMUNITY): Payer: Self-pay | Admitting: *Deleted

## 2013-05-06 DIAGNOSIS — R21 Rash and other nonspecific skin eruption: Secondary | ICD-10-CM

## 2013-05-06 DIAGNOSIS — T7840XA Allergy, unspecified, initial encounter: Secondary | ICD-10-CM

## 2013-05-06 MED ORDER — PREDNISONE 50 MG PO TABS: ORAL_TABLET | ORAL | Status: DC

## 2013-05-06 NOTE — ED Provider Notes (Signed)
CSN: [id number]     Arrival date & time 05/06/13  1713 History   None    Chief Complaint  Patient presents with  . Rash   (Consider location/radiation/quality/duration/timing/severity/associated sxs/prior Treatment) HPI  Rash: started today aroun lunch time. Started on back but has traveled to trunk, arms, shoulders, and around waist line. Itchy. Nonpainful. Denies n/v/d, fevers, CP, lightheadedness, SOB. Denies any medication changes or new foods. No new soaps or detergents. CT of pelvis on 8/20 w/ contrast. Denies sick contacts, recent fatigue, dysphagia or difficulty breathing, tongue swelling   Past Medical History  Diagnosis Date  . Ulcerative colitis    Past Surgical History  Procedure Laterality Date  . Nasal sinus surgery    . Hernia repair  1173    umbilical  . Colonoscopy  04/30/2013   Family History  Problem Relation Age of Onset  . Colon cancer Neg Hx   . Stomach cancer Paternal Uncle   . Hypertension Mother   . Diabetes Mother   . Cancer Mother   . Alzheimer's disease Father    History  Substance Use Topics  . Smoking status: Never Smoker   . Smokeless tobacco: Never Used  . Alcohol Use: No    Review of Systems  Constitutional: Negative for fever, activity change and fatigue.  Skin: Positive for rash.  All other systems reviewed and are negative.    Allergies  Oxycodone-acetaminophen  Home Medications   Current Outpatient Rx  Name  Route  Sig  Dispense  Refill  . mesalamine (CANASA) 1000 MG suppository   Rectal   Place 1 suppository (1,000 mg total) rectally at bedtime.   30 suppository   2   . MOVIPREP 100 G SOLR               . psyllium (METAMUCIL) 58.6 % powder   Oral   Take 1 packet by mouth 3 (three) times daily.         . predniSONE (DELTASONE) 50 MG tablet      Take one tablet daily   5 tablet   0   . PROCTOSOL HC 2.5 % rectal cream                BP 125/81  Pulse 57  Temp(Src) 98 F (36.7 C) (Oral)  Resp 16   SpO2 100% Physical Exam  Constitutional: He is oriented to person, place, and time. He appears well-developed and well-nourished. No distress.  HENT:  Head: Normocephalic and atraumatic.  Eyes: EOM are normal. Pupils are equal, round, and reactive to light.  Neck: Normal range of motion. Neck supple.  Cardiovascular: Normal rate.   Pulmonary/Chest: Effort normal.  Abdominal: Soft. He exhibits no distension.  Musculoskeletal: Normal range of motion. He exhibits no edema and no tenderness.  Neurological: He is alert and oriented to person, place, and time.  Skin: He is not diaphoretic.  Diffuse macular rash on back trunk arms and waist line  Psychiatric: He has a normal mood and affect. His behavior is normal. Judgment and thought content normal.    ED Course  Procedures (including critical care time) Labs Review Labs Reviewed - No data to display Imaging Review No results found.  MDM   1. Allergic reaction, initial encounter   2. Rash    42yo M w/ allergic reaction of unknown etiology. Systemic symptoms but no respiratory compromise. Possibly related to contrast from recent CT though 7 edays is fairly delayed.  - prednisone 50 x 5  days - benadryl PRN - precautions given and all questions answered  Linna Darner, MD Family Medicine PGY-3 05/06/2013, 7:00 PM      Waldemar Dickens, MD 05/06/13 1900

## 2013-05-06 NOTE — ED Notes (Addendum)
C/o rash with itching onset today at lunch.  He started itching on his L shoulderblade.  He lifted up his shirt and saw it all over his abd. Back, a little on his arms. Rash is small, red, flat and confluent in places on his back. Recent CT lower abdomen 8/20 and Colonoscopy on 8/22.  No medications now. CT scan showed possible avascular necrosis- for MRI 9/2.

## 2013-05-06 NOTE — ED Provider Notes (Signed)
Medical screening examination/treatment/procedure(s) were performed by resident physician or non-physician practitioner and as supervising physician I was immediately available for consultation/collaboration.   Pauline Good MD.   Billy Fischer, MD 05/06/13 2055

## 2013-05-11 ENCOUNTER — Ambulatory Visit (HOSPITAL_COMMUNITY)
Admission: RE | Admit: 2013-05-11 | Discharge: 2013-05-11 | Disposition: A | Discharge status: Home / Self Care | Payer: 59 | Source: Ambulatory Visit | Attending: Family Medicine | Admitting: Family Medicine

## 2013-05-11 DIAGNOSIS — M25559 Pain in unspecified hip: Secondary | ICD-10-CM | POA: Insufficient documentation

## 2013-05-11 DIAGNOSIS — M1610 Unilateral primary osteoarthritis, unspecified hip: Secondary | ICD-10-CM | POA: Insufficient documentation

## 2013-05-11 DIAGNOSIS — M898X9 Other specified disorders of bone, unspecified site: Secondary | ICD-10-CM | POA: Insufficient documentation

## 2013-05-11 DIAGNOSIS — M87059 Idiopathic aseptic necrosis of unspecified femur: Secondary | ICD-10-CM | POA: Insufficient documentation

## 2013-05-11 DIAGNOSIS — M169 Osteoarthritis of hip, unspecified: Secondary | ICD-10-CM | POA: Insufficient documentation

## 2013-05-11 DIAGNOSIS — M25551 Pain in right hip: Secondary | ICD-10-CM

## 2013-05-19 ENCOUNTER — Telehealth: Payer: Self-pay | Admitting: Family Medicine

## 2013-05-19 NOTE — Telephone Encounter (Signed)
Dr. Lorin Picket wanted to speak to patient. [facility]

## 2013-05-19 NOTE — Telephone Encounter (Signed)
Pt would like to know the results of this MRI done on Sept 2

## 2013-05-20 ENCOUNTER — Other Ambulatory Visit: Payer: Self-pay | Admitting: Family Medicine

## 2013-05-20 DIAGNOSIS — M25551 Pain in right hip: Secondary | ICD-10-CM

## 2013-05-20 MED ORDER — MELOXICAM 15 MG PO TABS: 15.0000 mg | ORAL_TABLET | Freq: Every day | ORAL | Status: DC

## 2013-05-20 NOTE — Telephone Encounter (Signed)
Dr. Nicki Reaper spoke with patient

## 2013-10-27 ENCOUNTER — Telehealth: Payer: Self-pay | Admitting: Internal Medicine

## 2013-10-27 NOTE — Telephone Encounter (Signed)
Pt wanted to have his Nexium 40mg  script refilled, states his PCP gave it to him 2 years ago. His PCP wouldn't refill it unless he sees the pt. Let pt know Dr. Henrene Pastor will not fill it unless he sees him either. Pt states he will call his PCP back.

## 2014-02-01 ENCOUNTER — Ambulatory Visit (INDEPENDENT_AMBULATORY_CARE_PROVIDER_SITE_OTHER): Payer: 59 | Admitting: Family Medicine

## 2014-02-01 ENCOUNTER — Encounter: Payer: Self-pay | Admitting: Family Medicine

## 2014-02-01 VITALS — BP 128/84 | Temp 98.3°F | Ht 70.0 in | Wt 210.1 lb

## 2014-02-01 DIAGNOSIS — K219 Gastro-esophageal reflux disease without esophagitis: Secondary | ICD-10-CM

## 2014-02-01 DIAGNOSIS — R7301 Impaired fasting glucose: Secondary | ICD-10-CM

## 2014-02-01 DIAGNOSIS — K5289 Other specified noninfective gastroenteritis and colitis: Secondary | ICD-10-CM

## 2014-02-01 DIAGNOSIS — M25559 Pain in unspecified hip: Secondary | ICD-10-CM

## 2014-02-01 DIAGNOSIS — M25551 Pain in right hip: Secondary | ICD-10-CM

## 2014-02-01 MED ORDER — PANTOPRAZOLE SODIUM 40 MG PO TBEC: 40.0000 mg | DELAYED_RELEASE_TABLET | Freq: Every day | ORAL | Status: DC

## 2014-02-01 NOTE — Progress Notes (Signed)
   Subjective:    Patient ID: Jerry Garcia, male    DOB: [birth year], 43 y.o.   MRN: [id number]  Gastrophageal Reflux He complains of heartburn. This is a chronic problem. The current episode started more than 1 month ago. The problem occurs frequently. The problem has been gradually worsening. The heartburn is of moderate intensity. The heartburn wakes him from sleep. The heartburn limits his activity. The heartburn changes with position. Nothing aggravates the symptoms. There are no known risk factors. He has tried an antacid for the symptoms. The treatment provided no relief.   Works with dr Henrene Pastor, lately has had some bleeding.  Tries to get under control and had a colonoscopy lately  Hip has acted up,  Patient states that he has been having pain in his groin area for about 8 months now.  Discoloration is noted in the area also.    Patient also had blood work done thru his employer and he would like to discuss.  Copy on front of chart.   Had an elevated sugar. Admits to some excess sugar in his diet. Positive family history diabetes. Now fasting sugar better.  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 MRI confirmed that. [place] orthopedist. They stated in the future would need surgery. Occasional right hip pain. Positive left medial thigh and groin pain intermittently. No dysuria no change in bowel habits.  Followed by the GI folks for his ulcerative colitis. Rare occasional blood in the stool.  Review of Systems  Gastrointestinal: Positive for heartburn.   No chest pain no back pain no abdominal pain no weight loss weight gain no change in bowels    Objective:   Physical Exam  Alert vitals stable. HEENT normal. Lungs clear. Heart regular in rhythm. Abdomen no tenderness. Testicles normal prostate normal no hernia no adenopathy.  All scans reviewed.      Assessment & Plan:  Impression 1 reflux discussed #2 ulcerative colitis followed by specialist discuss.  #3 left anterior groin and medial thigh pain likely musculoskeletal. Patient does not exercise at all. #3 impaired fasting glucose discussed. Plan symptomatic care discussed. Regular exercise strongly encourage. Initiate protonic. Blood work reviewed. Warning signs discussed. WSL

## 2014-06-13 ENCOUNTER — Telehealth: Payer: Self-pay | Admitting: Internal Medicine

## 2014-06-14 ENCOUNTER — Inpatient Hospital Stay (HOSPITAL_COMMUNITY)
Admission: EM | Admit: 2014-06-14 | Discharge: 2014-06-15 | DRG: 092 | Disposition: A | Discharge status: Home / Self Care | Payer: 59 | Attending: Internal Medicine | Admitting: Internal Medicine

## 2014-06-14 ENCOUNTER — Encounter (HOSPITAL_COMMUNITY): Payer: Self-pay | Admitting: Emergency Medicine

## 2014-06-14 ENCOUNTER — Other Ambulatory Visit: Payer: Self-pay

## 2014-06-14 ENCOUNTER — Emergency Department (INDEPENDENT_AMBULATORY_CARE_PROVIDER_SITE_OTHER)
Admission: EM | Admit: 2014-06-14 | Discharge: 2014-06-14 | Disposition: A | Discharge status: Nursing Home (Includes ICF) | Payer: 59 | Source: Home / Self Care | Attending: Emergency Medicine | Admitting: Emergency Medicine

## 2014-06-14 ENCOUNTER — Emergency Department (HOSPITAL_COMMUNITY): Payer: 59

## 2014-06-14 DIAGNOSIS — Z885 Allergy status to narcotic agent status: Secondary | ICD-10-CM | POA: Diagnosis not present

## 2014-06-14 DIAGNOSIS — R258 Other abnormal involuntary movements: Secondary | ICD-10-CM

## 2014-06-14 DIAGNOSIS — R253 Fasciculation: Secondary | ICD-10-CM

## 2014-06-14 DIAGNOSIS — R000 Tachycardia, unspecified: Secondary | ICD-10-CM | POA: Diagnosis present

## 2014-06-14 DIAGNOSIS — R0902 Hypoxemia: Secondary | ICD-10-CM

## 2014-06-14 DIAGNOSIS — K5190 Ulcerative colitis, unspecified, without complications: Secondary | ICD-10-CM

## 2014-06-14 DIAGNOSIS — M87151 Osteonecrosis due to drugs, right femur: Secondary | ICD-10-CM | POA: Diagnosis present

## 2014-06-14 DIAGNOSIS — K219 Gastro-esophageal reflux disease without esophagitis: Secondary | ICD-10-CM | POA: Diagnosis present

## 2014-06-14 DIAGNOSIS — T50995D Adverse effect of other drugs, medicaments and biological substances, subsequent encounter: Secondary | ICD-10-CM | POA: Diagnosis not present

## 2014-06-14 DIAGNOSIS — K2100 Gastro-esophageal reflux disease with esophagitis, without bleeding: Secondary | ICD-10-CM

## 2014-06-14 DIAGNOSIS — I471 Supraventricular tachycardia: Secondary | ICD-10-CM

## 2014-06-14 DIAGNOSIS — Z7952 Long term (current) use of systemic steroids: Secondary | ICD-10-CM

## 2014-06-14 HISTORY — DX: Pain in right hip: M25.551

## 2014-06-14 HISTORY — DX: Other chronic pain: G89.29

## 2014-06-14 LAB — I-STAT TROPONIN, ED: TROPONIN I, POC: 0 ng/mL (ref 0.00–0.08)

## 2014-06-14 LAB — COMPREHENSIVE METABOLIC PANEL
ALBUMIN: 4.2 g/dL (ref 3.5–5.2)
ALK PHOS: 75 U/L (ref 39–117)
ALT: 42 U/L (ref 0–53)
AST: 29 U/L (ref 0–37)
Anion gap: 17 — ABNORMAL HIGH (ref 5–15)
BILIRUBIN TOTAL: 0.3 mg/dL (ref 0.3–1.2)
BUN: 18 mg/dL (ref 6–23)
CHLORIDE: 100 meq/L (ref 96–112)
CO2: 23 meq/L (ref 19–32)
CREATININE: 0.85 mg/dL (ref 0.50–1.35)
Calcium: 8.9 mg/dL (ref 8.4–10.5)
GFR calc Af Amer: >90 mL/min (ref >90)
Glucose, Bld: 100 mg/dL — ABNORMAL HIGH (ref 70–99)
POTASSIUM: 3.8 meq/L (ref 3.7–5.3)
Sodium: 140 mEq/L (ref 137–147)
Total Protein: 7.7 g/dL (ref 6.0–8.3)

## 2014-06-14 LAB — CBC
HEMATOCRIT: 42.8 % (ref 39.0–52.0)
Hemoglobin: 14.6 g/dL (ref 13.0–17.0)
MCH: 29.6 pg (ref 26.0–34.0)
MCHC: 34.1 g/dL (ref 30.0–36.0)
MCV: 86.6 fL (ref 78.0–100.0)
PLATELETS: 350 10*3/uL (ref 150–400)
RBC: 4.94 MIL/uL (ref 4.22–5.81)
RDW: 13 % (ref 11.5–15.5)
WBC: 7.8 10*3/uL (ref 4.0–10.5)

## 2014-06-14 MED ORDER — ACETAMINOPHEN 500 MG PO TABS
1000.0000 mg | ORAL_TABLET | Freq: Once | ORAL | Status: AC
  Administered 2014-06-14: 1000 mg via ORAL
  Filled 2014-06-14: qty 2

## 2014-06-14 MED ORDER — IOHEXOL 350 MG/ML SOLN
100.0000 mL | Freq: Once | INTRAVENOUS | Status: AC | PRN
  Administered 2014-06-14: 100 mL via INTRAVENOUS

## 2014-06-14 MED ORDER — SODIUM CHLORIDE 0.9 % IV SOLN
INTRAVENOUS | Status: DC
  Administered 2014-06-14: 18:00:00 via INTRAVENOUS

## 2014-06-14 MED ORDER — MESALAMINE 1000 MG RE SUPP: 1000.0000 mg | Freq: Every day | RECTAL | Status: DC

## 2014-06-14 MED ORDER — ASPIRIN 81 MG PO CHEW
324.0000 mg | CHEWABLE_TABLET | Freq: Once | ORAL | Status: AC
  Administered 2014-06-14: 324 mg via ORAL
  Filled 2014-06-14: qty 4

## 2014-06-14 MED ORDER — LORAZEPAM 2 MG/ML IJ SOLN
1.0000 mg | Freq: Once | INTRAMUSCULAR | Status: AC
  Administered 2014-06-14: 1 mg via INTRAVENOUS
  Filled 2014-06-14: qty 1

## 2014-06-14 MED ORDER — SODIUM CHLORIDE 0.9 % IV BOLUS (SEPSIS)
1000.0000 mL | Freq: Once | INTRAVENOUS | Status: AC
  Administered 2014-06-14: 1000 mL via INTRAVENOUS

## 2014-06-14 NOTE — ED Notes (Signed)
Two week history of quick, intermittent pain in arm, leg, then chest.  Patient has been particularly bothered by this pain over the last few days.  Currently , only head hurts.

## 2014-06-14 NOTE — ED Notes (Signed)
Notified non-emergency [facility]

## 2014-06-14 NOTE — ED Notes (Signed)
PA at bedside upon arrival to room.

## 2014-06-14 NOTE — H&P (Signed)
[facility] History and Physical  [name] XBL:[id number] DOB: 1970-11-23 DOA: 06/14/2014  Referring physician: ED physician PCP: Jerry Battiest, MD  Specialists:   Chief Complaint: fasciculation and tachycardia.  HPI: Jerry Garcia is a 43 y.o. 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   Patient states that about 2 weeks ago he began having intermittent fasciculations in the left quadriceps femoris muscle. It progress upward and he began having it in his left pectoralis and the left arm. He does not have weakness, numbness or tingling sensations in extremities. No muscle spasm, gait disturbance, difficulty with speech or swallowing, or vertiginous symptoms. She denies using drug, smoking or drinking alcohol. The patient states that he drinks large Starbucks coffee daily. He also drinks a lot of sweet tea. He reports having discomfort over the right neck. He points to his sternocleidomastoid muscle. He has also had burning sensations in the right latissimus area. 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 No contact with unusual chemicals. He denies other symptoms, including fever, chills, cough, shortness of breath, nausea, vomiting, diaphoresis, chest pain, nausea, vomiting, diarrhea. No recent viral infection symptoms.  Patient was found to be very tachycardia in ED. CTA is negative for PE. He is admitted to inpatient for further evaluation and treatment   Review of Systems: As presented in the history of presenting illness, rest negative.  Where does patient live?  Lives with his mother at home Can patient participate in ADLs? Yes  Allergy:  Allergies  Allergen Reactions  . Oxycodone-Acetaminophen Other (See Comments)    nervousness    Past Medical History  Diagnosis Date  . Ulcerative colitis   . Chronic right hip pain     Past Surgical History   Procedure Laterality Date  . Nasal sinus surgery    . Hernia repair  3007    umbilical  . Colonoscopy  04/30/2013    Social History:  reports that he has never smoked. He has never used smokeless tobacco. He reports that he does not drink alcohol or use illicit drugs.  Family History:  Family History  Problem Relation Age of Onset  . Colon cancer Neg Hx   . Stomach cancer Paternal Uncle   . Hypertension Mother   . Diabetes Mother   . Cancer Mother   . Alzheimer's disease Father      Prior to Admission medications   Medication Sig Start Date End Date Taking? Authorizing Provider  acetaminophen (TYLENOL) 325 MG tablet Take 650 mg by mouth every 6 (six) hours as needed for moderate pain.   Yes Historical Provider, MD  mesalamine (CANASA) 1000 MG suppository Place 1 suppository (1,000 mg total) rectally at bedtime. 06/14/14  Yes Jerry Shipper, MD  psyllium (METAMUCIL) 58.6 % powder Take 1 packet by mouth daily.    Yes Historical Provider, MD    Physical Exam: Filed Vitals:   06/15/14 0015 06/15/14 0030 06/15/14 0055 06/15/14 0422  BP: 110/90  128/86 105/84  Pulse: 90 99 102 83  Temp:   98.1 F (36.7 C) 98.2 F (36.8 C)  TempSrc:   Oral Oral  Resp: 7 17 18 18   Height:   5' 10"  (1.778 m)   Weight:   94.892 kg (209 lb 3.2 oz)   SpO2: 92% 93% 99% 99%   General: Not in acute distress HEENT:       Eyes: PERRL, EOMI,  no scleral icterus       ENT: No discharge from the ears and nose, no pharynx injection, no tonsillar enlargement.        Neck: No JVD, no bruit, no mass felt. Cardiac: S1/S2, RRR, tachycardia, No murmurs, gallops or rubs Pulm: Good air movement bilaterally. Clear to auscultation bilaterally. No rales, wheezing, rhonchi or rubs. Abd: Soft, nondistended, nontender, no rebound pain, no organomegaly, BS present Ext: No edema. 2+DP/PT pulse bilaterally Musculoskeletal: No joint deformities, erythema, or stiffness, ROM full Skin: No rashes.  Neuro: Alert and oriented  X3, cranial nerves II-XII grossly intact, muscle strength 5/5 in all extremeties, sensation to light touch intact. Brachial reflex 2+ bilaterally. Knee reflex 1+ bilaterally. Negative Babinski's sign. Normal finger to nose test. Psych: Patient is not psychotic, no suicidal or hemocidal ideation.  Labs on Admission:  Basic Metabolic Panel:  Recent Labs Lab 06/14/14 1930 06/15/14 0233  NA 140 136*  K 3.8 3.8  CL 100 99  CO2 23 21  GLUCOSE 100* 98  BUN 18 18  CREATININE 0.85 0.87  CALCIUM 8.9 8.8  PHOS  --  3.6   Liver Function Tests:  Recent Labs Lab 06/14/14 1930  AST 29  ALT 42  ALKPHOS 75  BILITOT 0.3  PROT 7.7  ALBUMIN 4.2   No results found for this basename: LIPASE, AMYLASE,  in the last 168 hours No results found for this basename: AMMONIA,  in the last 168 hours CBC:  Recent Labs Lab 06/14/14 1930 06/15/14 0233  WBC 7.8 7.6  HGB 14.6 14.7  HCT 42.8 41.9  MCV 86.6 85.0  PLT 350 349   Cardiac Enzymes:  Recent Labs Lab 06/15/14 0233  CKTOTAL 449*  CKMB 3.9  TROPONINI <0.30    BNP (last 3 results) No results found for this basename: PROBNP,  in the last 8760 hours CBG: No results found for this basename: GLUCAP,  in the last 168 hours  Radiological Exams on Admission: Dg Chest 2 View  06/14/2014   CLINICAL DATA:  Left-sided chest pain for 3 days.  EXAM: CHEST  2 VIEW  COMPARISON:  None.  FINDINGS: The cardiac silhouette, mediastinal and hilar contours are within normal limits. Minimal streaky bibasilar atelectasis but no infiltrates or effusions. The bony thorax is intact.  IMPRESSION: Minimal streaky basilar atelectasis but no infiltrates, edema or effusions.   Electronically Signed   By: Kalman Jewels M.D.   On: 06/14/2014 20:50   Ct Angio Chest Pe W/cm &/or Wo Cm  06/14/2014   CLINICAL DATA:  Acute onset of tachycardia.  Initial encounter.  EXAM: CT ANGIOGRAPHY CHEST WITH CONTRAST  TECHNIQUE: Multidetector CT imaging of the chest was performed  using the standard protocol during bolus administration of intravenous contrast. Multiplanar CT image reconstructions and MIPs were obtained to evaluate the vascular anatomy.  CONTRAST:  154m OMNIPAQUE IOHEXOL 350 MG/ML SOLN  COMPARISON:  Chest radiograph performed earlier today at 8:37 p.m.  FINDINGS: There is no evidence of significant pulmonary embolus. Evaluation for pulmonary embolus is mildly suboptimal due to limitations in the timing of the contrast bolus.  Minimal left basilar atelectasis or scarring is noted. The lungs are otherwise clear. The lung bases are incompletely imaged on this study. There is no evidence of significant focal consolidation, pleural effusion or pneumothorax. No masses are identified; no abnormal focal contrast enhancement is seen.  The mediastinum is unremarkable in appearance. No mediastinal lymphadenopathy is seen. No pericardial effusion is identified. The great vessels are  grossly unremarkable in appearance. No axillary lymphadenopathy is seen. The visualized portions of the thyroid gland are unremarkable in appearance.  The visualized portions of the liver and spleen are unremarkable.  No acute osseous abnormalities are seen.  Review of the MIP images confirms the above findings.  IMPRESSION: 1. No evidence of significant pulmonary embolus. 2. Minimal left basilar atelectasis or scarring noted. Lungs otherwise clear.   Electronically Signed   By: Garald Balding M.D.   On: 06/14/2014 22:24    EKG: Independently reviewed.   Assessment/Plan Principal Problem:   Fasciculations of muscle Active Problems:   Ulcerative colitis   Tachycardia  1. Muscle fasciculation: Etiology is not clear. Differential diagnosis is very broad, mainly including neuromuscular disease, such as AML (less likely, no typical symptoms for both upper and lower motor signs), Guilian Barr syndrome (unlikley, deep tendon reflex is normal); myositis (possible). His calcium level is normal.  - will  admit to tele bed given tachycardia - check UDS, CK, ESR, ANA, TSH - check phosphorus level. - May consult to neurology or follow up with neuro as outpatient   2. Tachycardia: PE was ruled out by negative CTA. Not sure whether this is related to his fasciculation, but possible if patient has autonomous neuron dysfunction.  - tele monitor - 2D echo - trop x 3 - TSH  3. Ulcerative colitis: stable. On mesalamine at home - will continue home meds.  4. Elevated Anion gap: AG is 17 with normal bicarbonate level. Unclear etiology. - will check lactic acid, acetaminophen and salicylate levels. - check UA for ketone  DVT ppx: SQ Heparin   Code Status: Full code Family Communication: Yes, patient's mother  at bed side Disposition Plan: Admit to inpatient  Ivor Costa [facility] Pager [phone]  If 7PM-7AM, please contact night-coverage [email or web address] Password [email or web address] 06/15/2014, 4:51 AM

## 2014-06-14 NOTE — ED Notes (Signed)
PA at BS.  

## 2014-06-14 NOTE — ED Notes (Signed)
This RN ambulated pt with monitor, pt's HR 134, 02 sats reading 88% on RA, RR 40. Pt denies SOB, lightheadedness, or pain with ambulation.

## 2014-06-14 NOTE — ED Notes (Signed)
Dr Jake Michaelis made aware of patient and vital signs

## 2014-06-14 NOTE — Discharge Instructions (Signed)
We have determined that your problem requires further evaluation in the emergency department.  We will take care of your transport there.  Once at the emergency department, you will be evaluated by a provider and they will order whatever treatment or tests they deem necessary.  We cannot guarantee that they will do any specific test or do any specific treatment.  ° °

## 2014-06-14 NOTE — ED Provider Notes (Signed)
Chief Complaint   Fatigue   History of Present Illness   Jerry Garcia is a 43 year old male who presents with a 2 week history of muscle twitching which began in his left thigh, spread to his right arm, then to his left chest.  He denies any paresthesias except in the right neck.  No muscle weakness, vision problems, or other neurological symptoms.  He has had headache which he attributes to withdrawal from caffeine, and pain in the right neck.  He is not sleeping well at night and feels tired and run down.  He has had bilateral calf pain, burning sensation in right shoulderblade, and heartburn and indigestion.  He had an episode of chest pain 1 week ago that lasted a few seconds.  He denies any shortness of breath, or hemoptysis.    Review of Systems     Other than as noted above, the patient denies any of the following symptoms: Systemic:  No fever, chills, fatigue, myalgias, headache, or anorexia. Eye:  No redness, pain or drainage. ENT:  No earache, nasal congestion, rhinorrhea, sinus pressure, or sore throat. Lungs:  No cough, sputum production, wheezing, shortness of breath.  Cardiovascular:  No chest pain, palpitations, or syncope. GI:  No nausea, vomiting, abdominal pain or diarrhea. GU:  No dysuria, frequency, or hematuria. Skin:  No rash or pruritis.   [place]     Past medical history, family history, social history, meds, and allergies were reviewed.  He has colitis and takes Canasa.  Physical Examination    Vital signs:  BP 181/100  Pulse 133  Temp(Src) 98.4 F (36.9 C) (Oral)  Resp 20  Ht 5\' 10"  (1.778 m)  Wt 208 lb (94.348 kg)  BMI 29.84 kg/m2  SpO2 96% General:  Alert, in no distress. Eye:  PERRL, full EOMs.  Lids and conjunctivas were normal. ENT:  TMs and canals were normal, without erythema or inflammation.  Nasal mucosa was clear and uncongested, without drainage.  Mucous membranes were moist.  Pharynx was clear, without exudate or drainage.  There were no  oral ulcerations or lesions. Neck:  Supple, no adenopathy, tenderness or mass. Thyroid was normal. Lungs:  No respiratory distress.  Lungs were clear to auscultation, without wheezes, rales or rhonchi.  Breath sounds were clear and equal bilaterally. Heart:  Regular rhythm, without gallops, murmers or rubs. Abdomen:  Soft, flat, and non-tender to palpation.  No hepatosplenomagaly or mass. Neurological examination: The patient is alert and oriented x3. Speech is clear, fluent, and appropriate. Cranial nerves are intact. There is no pronator drift and finger to nose was normal. Muscle strength, sensation, and DTRs are normal. Station and gait were normal. Romberg sign is negative, patient is able to perform tandem gait well. Skin:  Clear, warm, and dry, without rash or lesions.  EKG Results:  Date: 06/14/2014  Rate: 133  Rhythm: sinus tachycardia  QRS Axis: right--90  Intervals: normal  ST/T Wave abnormalities: nonspecific ST changes  Conduction Disutrbances:none  Narrative Interpretation: Sinus tachycardia, rightward axis, nonspecific ST abnormality.  Old EKG Reviewed: none available   Course in Urgent [place]   The following medications were given:  Medications  0.9 %  sodium chloride infusion    Assessment   The primary encounter diagnosis was Muscle twitching. A diagnosis of Sinus tachycardia was also pertinent to this visit.  With the unilateral neurological symptoms, I am concerned about stroke.  Also, I cannot explain his tachycardia and this needs to be worked  up as well.   Plan     The patient was transferred to the ED via EMS in stable condition.  Medical Decision Making:  43 year old male has a 2 week history of twitching of left thigh, then left arm, then left chest.  Also has headache, right neck pain and numbness, insomnia, bilateral calf pain, fleeting chest pain (1 week ago for few seconds).  His exam is entirely normal.  EKG shows sinus tach and nonspecific ST  changes.  I am concerned about stroke (but he's not a code stroke), also, I don't know why his heart rate is so fast and this needs workup as well.          Harden Mo, MD 06/14/14 [phone]

## 2014-06-14 NOTE — ED Notes (Signed)
Reports not feeling weak, fatigue, and onset 2 weeks ago

## 2014-06-14 NOTE — ED Notes (Signed)
Per EMS pt is from [facility], pt reports he has had a pulsating sensation in his left lateral chest wall, left upper arm, and left upper leg. Pt reports intermittent mid chest wall discomfort that comes and goes. Pt reports he feels weak and anxious. Pt ST on the monitor. Pt also reports a burning sensation in his right shoulder and a bad HA. All sx started two weeks ago. Pt is A&O X4.

## 2014-06-14 NOTE — Telephone Encounter (Signed)
Lm on vm for patient to come pick Canasa rx up

## 2014-06-14 NOTE — ED Provider Notes (Signed)
CSN: [id number]     Arrival date & time 06/14/14  1922 History   First MD Initiated Contact with Patient 06/14/14 1922     Chief Complaint  Patient presents with  . Chest Pain  . Headache     (Consider location/radiation/quality/duration/timing/severity/associated sxs/prior Treatment) Patient is a 43 y.o. male presenting with chest pain and headaches. The history is provided by the nursing home.  Chest Pain Associated symptoms: headache   Headache   Jerry Garcia Is a 43 year old male who presents to the emergency department via urgent care for questionable cardiac versus stroke symptoms. Patient states that about 2 weeks ago he began having intermittent fasciculations in the left quadriceps femoris muscle. He states progress upward and he began having it in his left pectoralis and the left arm. The patient states that he drinks about large Starbucks coffee daily. He also drinks a lot of sweet tea. Patient thought that maybe his sensations were due to this and stopped taking it acutely 2 days ago. At that point he developed a fairly bad global headache which resolved when he had a sweet tea. He also complains of pain in the right anterior neck. He points to his sternocleidomastoid muscle. He has also had fleeting burning sensations in the right latissimus area. 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 He denies any shortness of breath, nausea, vomiting, diaphoresis. He has no family history of coronary artery disease. He denies history of hypertension, hyperlipidemia, smoking or diabetes. The patient denies vision changes, unilateral weakness, numbness or paresthesia, gait disturbance, difficulty with speech or swallowing, or vertiginous symptoms. He denies a history of anxiety however he states he feels very anxious right now.  Past Medical History  Diagnosis Date  . Ulcerative colitis    Past Surgical History   Procedure Laterality Date  . Nasal sinus surgery    . Hernia repair  2542    umbilical  . Colonoscopy  04/30/2013   Family History  Problem Relation Age of Onset  . Colon cancer Neg Hx   . Stomach cancer Paternal Uncle   . Hypertension Mother   . Diabetes Mother   . Cancer Mother   . Alzheimer's disease Father    History  Substance Use Topics  . Smoking status: Never Smoker   . Smokeless tobacco: Never Used  . Alcohol Use: No    Review of Systems  Cardiovascular: Positive for chest pain.  Neurological: Positive for headaches.    Ten systems reviewed and are negative for acute change, except as noted in the HPI.    Allergies  Oxycodone-acetaminophen  Home Medications   Prior to Admission medications   Medication Sig Start Date End Date Taking? Authorizing Provider  mesalamine (CANASA) 1000 MG suppository Place 1 suppository (1,000 mg total) rectally at bedtime. 06/14/14   Irene Shipper, MD  pantoprazole (PROTONIX) 40 MG tablet Take 1 tablet (40 mg total) by mouth daily. 02/01/14 02/01/15  Mikey Kirschner, MD  psyllium (METAMUCIL) 58.6 % powder Take 1 packet by mouth 3 (three) times daily.    Historical Provider, MD   BP 155/98  Pulse 102  Temp(Src) 98.1 F (36.7 C) (Oral)  Resp 24  Wt 208 lb (94.348 kg)  SpO2 99% Physical Exam  Nursing note and vitals reviewed. Constitutional: He appears well-developed and well-nourished. No distress.  HENT:  Head: Normocephalic and atraumatic.  Eyes: Conjunctivae are normal. No scleral  icterus.  Neck: Normal range of motion. Neck supple.  Cardiovascular: Normal rate, regular rhythm and normal heart sounds.   Pulmonary/Chest: Effort normal and breath sounds normal. No respiratory distress.  Abdominal: Soft. There is no tenderness.  Musculoskeletal: He exhibits no edema.  Neurological: He is alert.  Speech is clear and goal oriented, follows commands Major Cranial nerves without deficit, no facial droop Normal strength in  upper and lower extremities bilaterally including dorsiflexion and plantar flexion, strong and equal grip strength Sensation normal to light and sharp touch Moves extremities without ataxia, coordination intact Normal finger to nose and rapid alternating movements Neg romberg, no pronator drift Normal gait Normal heel-shin and balance   Skin: Skin is warm and dry. He is not diaphoretic.  Psychiatric:  Anxious    ED Course  Procedures (including critical care time) Labs Review Labs Reviewed  COMPREHENSIVE METABOLIC PANEL  [place], ED    Imaging Review No results found.   EKG Interpretation   Date/Time:  Tuesday June 14 2014 19:36:19 EDT Ventricular Rate:  110 PR Interval:  121 QRS Duration: 98 QT Interval:  334 QTC Calculation: 452 R Axis:   65 Text Interpretation:  Sinus tachycardia Artifact No old tracing to compare  Confirmed by [facility]  MD, Nunzio Cory [phone]) on 06/14/2014 7:57:16 PM      MDM   Final diagnoses:  None     Patient with multiple complaints., Vague and non-specific, I doubt cardiac cause or stroke. Patient seems very nervous. I will give fluids and ativan, cardiac workup and reevaluate.     9:31 PM BP 132/85  Pulse 108  Temp(Src) 98.1 F (36.7 C) (Oral)  Resp 14  Ht 5\' 10"  (1.778 m)  Wt 208 lb (94.348 kg)  BMI 29.84 kg/m2  SpO2 95% Patient eith presistent tachycardia despite fluids and ativan. Chest pain and and hypertension. 02 low at 92-94% on RA. Will obtain a ct angio chest to r/o PE.   Patient desaturated in the ED with ambulation to 88% on RA. HR up to 134. Awaiting CTA.    10:29 PM BP 137/81  Pulse 106  Temp(Src) 98.1 F (36.7 C) (Oral)  Resp 11  Ht 5\' 10"  (1.778 m)  Wt 208 lb (94.348 kg)  BMI 29.84 kg/m2  SpO2 89% Patient CTA negative. persistent tachycardia and hypoxia. Will consult for medical admission.  Patient accepted for admission by Dr. Cherie Dark, PA-C 06/22/14 1309

## 2014-06-14 NOTE — ED Notes (Signed)
PA informed of pt's request for pain medication. See new orders.

## 2014-06-15 ENCOUNTER — Telehealth: Payer: Self-pay | Admitting: Family Medicine

## 2014-06-15 DIAGNOSIS — I471 Supraventricular tachycardia: Secondary | ICD-10-CM

## 2014-06-15 DIAGNOSIS — R253 Fasciculation: Principal | ICD-10-CM

## 2014-06-15 DIAGNOSIS — R000 Tachycardia, unspecified: Secondary | ICD-10-CM

## 2014-06-15 LAB — LACTATE DEHYDROGENASE: LDH: 179 U/L (ref 94–250)

## 2014-06-15 LAB — URINALYSIS, ROUTINE W REFLEX MICROSCOPIC
Bilirubin Urine: NEGATIVE
GLUCOSE, UA: NEGATIVE mg/dL
HGB URINE DIPSTICK: NEGATIVE
KETONES UR: NEGATIVE mg/dL
Leukocytes, UA: NEGATIVE
Nitrite: NEGATIVE
PROTEIN: NEGATIVE mg/dL
Specific Gravity, Urine: 1.022 (ref 1.005–1.030)
UROBILINOGEN UA: 0.2 mg/dL (ref 0.0–1.0)
pH: 7 (ref 5.0–8.0)

## 2014-06-15 LAB — RAPID URINE DRUG SCREEN, HOSP PERFORMED
Amphetamines: NOT DETECTED
Barbiturates: NOT DETECTED
Benzodiazepines: NOT DETECTED
Cocaine: NOT DETECTED
OPIATES: NOT DETECTED
TETRAHYDROCANNABINOL: NOT DETECTED

## 2014-06-15 LAB — LACTIC ACID, PLASMA: Lactic Acid, Venous: 1.2 mmol/L (ref 0.5–2.2)

## 2014-06-15 LAB — BASIC METABOLIC PANEL
Anion gap: 16 — ABNORMAL HIGH (ref 5–15)
BUN: 18 mg/dL (ref 6–23)
CALCIUM: 8.8 mg/dL (ref 8.4–10.5)
CO2: 21 meq/L (ref 19–32)
CREATININE: 0.87 mg/dL (ref 0.50–1.35)
Chloride: 99 mEq/L (ref 96–112)
GFR calc Af Amer: >90 mL/min (ref >90)
Glucose, Bld: 98 mg/dL (ref 70–99)
Potassium: 3.8 mEq/L (ref 3.7–5.3)
SODIUM: 136 meq/L — AB (ref 137–147)

## 2014-06-15 LAB — CBC
HEMATOCRIT: 41.9 % (ref 39.0–52.0)
Hemoglobin: 14.7 g/dL (ref 13.0–17.0)
MCH: 29.8 pg (ref 26.0–34.0)
MCHC: 35.1 g/dL (ref 30.0–36.0)
MCV: 85 fL (ref 78.0–100.0)
PLATELETS: 349 10*3/uL (ref 150–400)
RBC: 4.93 MIL/uL (ref 4.22–5.81)
RDW: 13 % (ref 11.5–15.5)
WBC: 7.6 10*3/uL (ref 4.0–10.5)

## 2014-06-15 LAB — CK: CK TOTAL: 357 U/L — AB (ref 7–232)

## 2014-06-15 LAB — TROPONIN I
Troponin I: <0.3 ng/mL (ref <0.30)
Troponin I: <0.3 ng/mL (ref <0.30)

## 2014-06-15 LAB — ACETAMINOPHEN LEVEL: Acetaminophen (Tylenol), Serum: <15 ug/mL (ref 10–30)

## 2014-06-15 LAB — CK TOTAL AND CKMB (NOT AT ARMC)
CK, MB: 3.9 ng/mL (ref 0.3–4.0)
Relative Index: 0.9 (ref 0.0–2.5)
Total CK: 449 U/L — ABNORMAL HIGH (ref 7–232)

## 2014-06-15 LAB — MAGNESIUM: Magnesium: 2.3 mg/dL (ref 1.5–2.5)

## 2014-06-15 LAB — SEDIMENTATION RATE: Sed Rate: 3 mm/hr (ref 0–16)

## 2014-06-15 LAB — TSH: TSH: 2.69 u[IU]/mL (ref 0.350–4.500)

## 2014-06-15 LAB — SALICYLATE LEVEL: Salicylate Lvl: <2 mg/dL — ABNORMAL LOW (ref 2.8–20.0)

## 2014-06-15 LAB — PHOSPHORUS: PHOSPHORUS: 3.6 mg/dL (ref 2.3–4.6)

## 2014-06-15 LAB — T4, FREE: Free T4: 1.06 ng/dL (ref 0.80–1.80)

## 2014-06-15 MED ORDER — LORAZEPAM 1 MG PO TABS: 1.0000 mg | ORAL_TABLET | Freq: Two times a day (BID) | ORAL | Status: DC | PRN

## 2014-06-15 MED ORDER — SODIUM CHLORIDE 0.9 % IV SOLN
INTRAVENOUS | Status: DC
  Administered 2014-06-15: 1000 mL via INTRAVENOUS
  Administered 2014-06-15: 02:00:00 via INTRAVENOUS

## 2014-06-15 MED ORDER — SODIUM CHLORIDE 0.9 % IJ SOLN
3.0000 mL | Freq: Two times a day (BID) | INTRAMUSCULAR | Status: DC
  Administered 2014-06-15: 3 mL via INTRAVENOUS

## 2014-06-15 MED ORDER — SODIUM CHLORIDE 0.9 % IJ SOLN
3.0000 mL | Freq: Two times a day (BID) | INTRAMUSCULAR | Status: DC
  Administered 2014-06-15 (×2): 3 mL via INTRAVENOUS

## 2014-06-15 MED ORDER — MESALAMINE 1000 MG RE SUPP
1000.0000 mg | Freq: Every day | RECTAL | Status: DC
  Administered 2014-06-15: 1000 mg via RECTAL
  Filled 2014-06-15 (×2): qty 1

## 2014-06-15 MED ORDER — HEPARIN SODIUM (PORCINE) 5000 UNIT/ML IJ SOLN
5000.0000 [IU] | Freq: Three times a day (TID) | INTRAMUSCULAR | Status: DC
  Filled 2014-06-15 (×3): qty 1

## 2014-06-15 MED ORDER — PSYLLIUM 95 % PO PACK
1.0000 | PACK | Freq: Every day | ORAL | Status: DC
  Administered 2014-06-15: 1 via ORAL
  Filled 2014-06-15: qty 1

## 2014-06-15 MED ORDER — ACETAMINOPHEN 325 MG PO TABS
650.0000 mg | ORAL_TABLET | Freq: Four times a day (QID) | ORAL | Status: DC | PRN
  Administered 2014-06-15 (×2): 650 mg via ORAL
  Filled 2014-06-15 (×2): qty 2

## 2014-06-15 MED ORDER — SODIUM CHLORIDE 0.9 % IV SOLN: 250.0000 mL | INTRAVENOUS | Status: DC | PRN

## 2014-06-15 MED ORDER — SODIUM CHLORIDE 0.9 % IJ SOLN: 3.0000 mL | INTRAMUSCULAR | Status: DC | PRN

## 2014-06-15 NOTE — Progress Notes (Signed)
Pt requesting ativan to take at night to sleep when d/c.  Dr. Clementeen Graham informed and instructed that he was at [facility]. At the time , to informed pt to f/u with  his MD at d/c .  Pt and mother at bedside made aware.  Verbalized understanding.  Karie Kirks, Therapist, sports.

## 2014-06-15 NOTE — Telephone Encounter (Signed)
Pt recently inpatient, all tests done for the heart issues came back negative, he was given LORazepam (ATIVAN) while inpatient, they told him his symptoms could be anxiety related, he states the medicine worked well calming him down, discharge doctor told pt to get Rx he would have to call us.  He is scheduled to see Dr. Richardson Landry for hospital follow up on 06/22/14.  Please advise  [facility]/[place]

## 2014-06-15 NOTE — Consult Note (Signed)
Consult  Reason for Consult: muscle fasciculation Referring Physician: Dr Clementeen Graham  CC: fasciculations  HPI: Jerry Garcia is is a 43 y.o. 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   Symptoms started 1 to 2 weeks ago with brief internal fasciculations (non-visible just the sensation) of the left quadriceps femoris muscle. This sensation would be worse with movement. Progressed to also involve the LUE, predominantly the biceps and pectoralis muscle. These were visible fasciculations at rest and with movement. These occur intermittently throughout the day with no particular trigger.   He also notes a new onset headache in the past week. Described as a frontal dull aching pain. No nausea or emesis. No change in vision. No focal weakness or sensory changes. No prior headache history. Also describes a burning pain in R SCM region and R lateral lumbar region.  He does have a high caffeine intake (one large Starbucks coffee and multiple sweet teas daily). He stopped all caffeine intake a few days after the fasciculations started. This subsequently made his headache worse. He does not think stopping the caffeine had any affect on his fasciculations. No prior history of fasciculations or muscle weakness. Denies any excessive anxiety or stress. Does a lot of heavy lifting for work. No family history of any neurological disorders.  Of note, he does describe having a tick bite 2-3 weeks ago, describes a small red pruritic rash after removing the tick but no "bulls eye" appearance.     Past Medical History  Diagnosis Date  . Ulcerative colitis   . Chronic right hip pain     Past Surgical History  Procedure Laterality Date  . Nasal sinus surgery    . Hernia repair  5784    umbilical  . Colonoscopy  04/30/2013    Family History  Problem Relation Age of Onset  . Colon cancer Neg Hx   . Stomach  cancer Paternal Uncle   . Hypertension Mother   . Diabetes Mother   . Cancer Mother   . Alzheimer's disease Father     Social History:  reports that he has never smoked. He has never used smokeless tobacco. He reports that he does not drink alcohol or use illicit drugs.  Allergies  Allergen Reactions  . Oxycodone-Acetaminophen Other (See Comments)    nervousness    Medications:  Scheduled: . heparin  5,000 Units Subcutaneous 3 times per day  . mesalamine  1,000 mg Rectal QHS  . psyllium  1 packet Oral Daily  . sodium chloride  3 mL Intravenous Q12H  . sodium chloride  3 mL Intravenous Q12H    ROS: Out of a complete 14 system review, the patient complains of only the following symptoms, and all other reviewed systems are negative.  Physical Examination: Blood pressure 132/83, pulse 95, temperature 98.2 F (36.8 C), temperature source Oral, resp. rate 20, height 5\' 10"  (1.778 m), weight 94.892 kg (209 lb 3.2 oz), SpO2 99.00%.  Neurologic Examination Mental Status: Alert, oriented, thought content appropriate.  Speech fluent without evidence of aphasia.  Able to follow 3 step commands without difficulty. Cranial Nerves: II: funduscopic exam wnl bilaterally, visual fields grossly normal, pupils equal, round, reactive to light and accommodation III,IV, VI: ptosis not present, extra-ocular motions intact bilaterally V,VII: smile symmetric, facial light touch sensation normal bilaterally VIII: hearing normal bilaterally IX,X: gag reflex present XI: trapezius strength/neck flexion strength normal bilaterally XII: tongue strength  normal, no tongue fasciculations Motor: No fasciculations noted at rest, movement or with tactile stimulation (tapping muscles) Right : Upper extremity    Left:     Upper extremity 5/5 deltoid       5/5 deltoid 5/5 biceps      5/5 biceps  5/5 triceps      5/5 triceps 5/5wrist flexion     5/5 wrist flexion 5/5 wrist extension     5/5 wrist  extension 5/5 hand grip      5/5 hand grip  Lower extremity     Lower extremity 5/5 hip flexor      5/5 hip flexor 5/5 hip adductors     5/5 hip adductors 5/5 hip abductors     5/5 hip abductors 5/5 quadricep      5/5 quadriceps  5/5 hamstrings     5/5 hamstrings 5/5 plantar flexion       5/5 plantar flexion 5/5 plantar extension     5/5 plantar extension Tone and bulk:normal tone throughout; no atrophy noted Sensory: Pinprick and light touch intact throughout, bilaterally Deep Tendon Reflexes: 2+ and symmetric throughout Plantars: Right: downgoing   Left: downgoing Cerebellar: normal finger-to-nose, normal rapid alternating movements and normal heel-to-shin test Did not walk due to IV running  Laboratory Studies:   Basic Metabolic Panel:  Recent Labs Lab 06/14/14 1930 06/15/14 0233  NA 140 136*  K 3.8 3.8  CL 100 99  CO2 23 21  GLUCOSE 100* 98  BUN 18 18  CREATININE 0.85 0.87  CALCIUM 8.9 8.8  PHOS  --  3.6    Liver Function Tests:  Recent Labs Lab 06/14/14 1930  AST 29  ALT 42  ALKPHOS 75  BILITOT 0.3  PROT 7.7  ALBUMIN 4.2   No results found for this basename: LIPASE, AMYLASE,  in the last 168 hours No results found for this basename: AMMONIA,  in the last 168 hours  CBC:  Recent Labs Lab 06/14/14 1930 06/15/14 0233  WBC 7.8 7.6  HGB 14.6 14.7  HCT 42.8 41.9  MCV 86.6 85.0  PLT 350 349    Cardiac Enzymes:  Recent Labs Lab 06/15/14 0233 06/15/14 0801  CKTOTAL 449*  --   CKMB 3.9  --   TROPONINI <0.30 <0.30    BNP: No components found with this basename: POCBNP,   CBG: No results found for this basename: GLUCAP,  in the last 168 hours  Microbiology: No results found for this or any previous visit.  Coagulation Studies: No results found for this basename: LABPROT, INR,  in the last 72 hours  Urinalysis:  Recent Labs Lab 06/15/14 0851  COLORURINE YELLOW  LABSPEC 1.022  PHURINE 7.0  [place]  UROBILINOGEN 0.2  NITRITE NEGATIVE  LEUKOCYTESUR NEGATIVE    Lipid Panel:  No results found for this basename: chol, trig, hdl, cholhdl, vldl, ldlcalc    HgbA1C:  No results found for this basename: HGBA1C    Urine Drug Screen:     Component Value Date/Time   LABOPIA NONE DETECTED 06/15/2014 0851   COCAINSCRNUR NONE DETECTED 06/15/2014 0851   LABBENZ NONE DETECTED 06/15/2014 0851   AMPHETMU NONE DETECTED 06/15/2014 0851   THCU NONE DETECTED 06/15/2014 0851   LABBARB NONE DETECTED 06/15/2014 0851    Alcohol Level: No results found for this basename: ETH,  in the last 168 hours   Imaging: Dg Chest 2 View  06/14/2014  CLINICAL DATA:  Left-sided chest pain for 3 days.  EXAM: CHEST  2 VIEW  COMPARISON:  None.  FINDINGS: The cardiac silhouette, mediastinal and hilar contours are within normal limits. Minimal streaky bibasilar atelectasis but no infiltrates or effusions. The bony thorax is intact.  IMPRESSION: Minimal streaky basilar atelectasis but no infiltrates, edema or effusions.   Electronically Signed   By: Kalman Jewels M.D.   On: 06/14/2014 20:50   Ct Angio Chest Pe W/cm &/or Wo Cm  06/14/2014   CLINICAL DATA:  Acute onset of tachycardia.  Initial encounter.  EXAM: CT ANGIOGRAPHY CHEST WITH CONTRAST  TECHNIQUE: Multidetector CT imaging of the chest was performed using the standard protocol during bolus administration of intravenous contrast. Multiplanar CT image reconstructions and MIPs were obtained to evaluate the vascular anatomy.  CONTRAST:  116mL OMNIPAQUE IOHEXOL 350 MG/ML SOLN  COMPARISON:  Chest radiograph performed earlier today at 8:37 p.m.  FINDINGS: There is no evidence of significant pulmonary embolus. Evaluation for pulmonary embolus is mildly suboptimal due to limitations in the timing of the contrast bolus.  Minimal left basilar atelectasis or scarring is noted. The lungs are otherwise clear. The lung  bases are incompletely imaged on this study. There is no evidence of significant focal consolidation, pleural effusion or pneumothorax. No masses are identified; no abnormal focal contrast enhancement is seen.  The mediastinum is unremarkable in appearance. No mediastinal lymphadenopathy is seen. No pericardial effusion is identified. The great vessels are grossly unremarkable in appearance. No axillary lymphadenopathy is seen. The visualized portions of the thyroid gland are unremarkable in appearance.  The visualized portions of the liver and spleen are unremarkable.  No acute osseous abnormalities are seen.  Review of the MIP images confirms the above findings.  IMPRESSION: 1. No evidence of significant pulmonary embolus. 2. Minimal left basilar atelectasis or scarring noted. Lungs otherwise clear.   Electronically Signed   By: Garald Balding M.D.   On: 06/14/2014 22:24     Assessment/Plan: 43y/o gentleman presenting for initial evaluation of new onset fasciculations involving the left side. There is a broad differential for his symptoms including benign fasciculations vs less likely to be a motor neuron disorder. Overall he appears clinically stable. Would suggest checking magnesium, PTH, Lyme and RMSF (with hx of tick bite) and repeat CK. If lab workup is unremarkable suggest completing workup as outpatient with an EMG/NCS. Can consider MRI brain and C spine at that time if warranted.   He has an EMG/NCS appointment scheduled with Dr Georgia Dom at [facility] on Monday, October 12 at 10:15am.    Jim Like, DO [facility] [phone]  If 7pm- 7am, please page neurology on call as listed in [place]. 06/15/2014, 12:57 PM

## 2014-06-15 NOTE — Progress Notes (Signed)
Pt/c floor via w/c to awaiting transport.

## 2014-06-15 NOTE — Discharge Summary (Signed)
Physician Discharge Summary  Jerry Garcia HTD:[id number] DOB: [birth year] DOA: 06/14/2014  PCP: Rubbie Battiest, MD  Admit date: 06/14/2014 Discharge date: 06/15/2014  Time spent: 25 minutes  Recommendations for Outpatient Follow-up:  1. Discharge home with PCP and neurology follow up. Please check labs sent ( lyme titre, PTH, mg level on 10/7) 2.  outpt EMG scheduled for 10/12 @10 :15 am with [facility]  Discharge Diagnoses:  Principal Problem:   Fasciculations of muscle  Active Problems:   Ulcerative colitis   Tachycardia   Discharge Condition: fair  Diet recommendation: regular  Filed Weights   06/14/14 1924 06/14/14 1945 06/15/14 0055  Weight: 94.348 kg (208 lb) 94.348 kg (208 lb) 94.892 kg (209 lb 3.2 oz)    History of present illness:  Please refer to admission H&P for details, but in brief, 43 y.o. 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  about 2 weeks ago he began having intermittent fasciculations in the left quadriceps femoris muscle. It progress upward and he began having it in his left pectoralis and the left arm. He denied weakness, numbness or tingling  in his extremities. No muscle spasm, gait disturbance, vertigo, difficulty with speech or swallowing. denies using drug, smoking or  Alcohol use.  patient  drinks large cup of Starbucks coffee daily along with sweet tea few times a day. He reports having discomfort over the right neck. He points to his sternocleidomastoid muscle. He has also had burning sensations in the right latissimus area. 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 Denies any chemical exposure. Marland Kitchen He denied  fever, chills, cough, shortness of breath, nausea, vomiting, diaphoresis, chest pain or ,  diarrhea. No recent viral infection symptoms.  Patient was found to be very tachycardia in ED. CTA  negative for PE. He was admitted for further evaluation.   Hospital Course:  New onset  fasciculations Possibly benign. W/up so far including CBC, CMET , UA and UDS, ESR, TSH were all normal. Mildly elevated CPK to 450. Has not had further symptoms except for some palpitations. EKG and 2 sets of troponin negative. 2D echo with normal EF. Seen by neurology consult and suggests this is likely benign condition. Pt did mention to the neurologist of having a tick bite about 3 weeks back. Have ordered lyme titre, magnesium level and PTH which can be followed as outpt. An outpt EMG has been arranged by neurology for next week.  Labs will be sent prior to discharge which can be followed as outpt  Tachycardia  pt stable on telemetry. 2 sets of troponin negative 2D echo with normal EF and trivial MR   Procedures:  CT angio chest  2 d echo  Consultations:  neuohospitalist  Discharge Exam: Filed Vitals:   06/15/14 1353  BP: 121/82  Pulse: 90  Temp: 97.4 F (36.3 C)  Resp: 20    General: NAD HEENT: moist mucosa Chest: clear b/l  CVS: NS1&S2, no murmurs Abd: soft, NT, ND Ext: warm, no edema, no rash CNS: alert and oriented, normal motor tone, power and reflexes in b/l extremities, normal sensations b/l   Discharge Instructions You were cared for by a hospitalist during your hospital stay. If you have any questions about your discharge medications or the care you received while you were in  the hospital after you are discharged, you can call the unit and asked to speak with the hospitalist on call if the hospitalist that took care of you is not available. Once you are discharged, your primary care physician will handle any further medical issues. Please note that NO REFILLS for any discharge medications will be authorized once you are discharged, as it is imperative that you return to  your primary care physician (or establish a relationship with a primary care physician if you do not have one) for your aftercare needs so that they can reassess your need for medications and monitor your lab values.   Current Discharge Medication List    CONTINUE these medications which have NOT CHANGED   Details  acetaminophen (TYLENOL) 325 MG tablet Take 650 mg by mouth every 6 (six) hours as needed for moderate pain.    mesalamine (CANASA) 1000 MG suppository Place 1 suppository (1,000 mg total) rectally at bedtime. Qty: 30 suppository, Refills: 2    psyllium (METAMUCIL) 58.6 % powder Take 1 packet by mouth daily.        Allergies  Allergen Reactions  . Oxycodone-Acetaminophen Other (See Comments)    nervousness   Follow-up Information   Follow up with Rubbie Battiest, MD. Schedule an appointment as soon as possible for a visit in 1 week.   Specialty:  Family Medicine   Contact information:   [address] [place] [address] [phone]       Follow up with Melvenia Beam, MD On 06/20/2014. (@10 :15 am)    Specialty:  Neurology   Contact information:   [address] [place]  [address] [phone]        The results of significant diagnostics from this hospitalization (including imaging, microbiology, ancillary and laboratory) are listed below for reference.    Significant Diagnostic Studies: Dg Chest 2 View  06/14/2014   CLINICAL DATA:  Left-sided chest pain for 3 days.  EXAM: CHEST  2 VIEW  COMPARISON:  None.  FINDINGS: The cardiac silhouette, mediastinal and hilar contours are within normal limits. Minimal streaky bibasilar atelectasis but no infiltrates or effusions. The bony thorax is intact.  IMPRESSION: Minimal streaky basilar atelectasis but no infiltrates, edema or effusions.   Electronically Signed   By: Kalman Jewels M.D.   On: 06/14/2014 20:50   Ct Angio Chest Pe W/cm &/or Wo Cm  06/14/2014   CLINICAL DATA:  Acute onset of  tachycardia.  Initial encounter.  EXAM: CT ANGIOGRAPHY CHEST WITH CONTRAST  TECHNIQUE: Multidetector CT imaging of the chest was performed using the standard protocol during bolus administration of intravenous contrast. Multiplanar CT image reconstructions and MIPs were obtained to evaluate the vascular anatomy.  CONTRAST:  15m OMNIPAQUE IOHEXOL 350 MG/ML SOLN  COMPARISON:  Chest radiograph performed earlier today at 8:37 p.m.  FINDINGS: There is no evidence of significant pulmonary embolus. Evaluation for pulmonary embolus is mildly suboptimal due to limitations in the timing of the contrast bolus.  Minimal left basilar atelectasis or scarring is noted. The lungs are otherwise clear. The lung bases are incompletely imaged on this study. There is no evidence of significant focal consolidation, pleural effusion or pneumothorax. No masses are identified; no abnormal focal contrast enhancement is seen.  The mediastinum is unremarkable in appearance. No mediastinal lymphadenopathy is seen. No pericardial effusion is identified. The great vessels are grossly unremarkable in appearance. No axillary lymphadenopathy is seen. The visualized portions of the thyroid gland are unremarkable in appearance.  The visualized portions of the liver and spleen are unremarkable.  No acute osseous abnormalities are seen.  Review of the MIP images confirms the above findings.  IMPRESSION: 1. No evidence of significant pulmonary embolus. 2. Minimal left basilar atelectasis or scarring noted. Lungs otherwise clear.   Electronically Signed   By: Garald Balding M.D.   On: 06/14/2014 22:24    Microbiology: No results found for this or any previous visit (from the past 240 hour(s)).   Labs: Basic Metabolic Panel:  Recent Labs Lab 06/14/14 1930 06/15/14 0233  NA 140 136*  K 3.8 3.8  CL 100 99  CO2 23 21  GLUCOSE 100* 98  BUN 18 18  CREATININE 0.85 0.87  CALCIUM 8.9 8.8  PHOS  --  3.6   Liver Function Tests:  Recent  Labs Lab 06/14/14 1930  AST 29  ALT 42  ALKPHOS 75  BILITOT 0.3  PROT 7.7  ALBUMIN 4.2   No results found for this basename: LIPASE, AMYLASE,  in the last 168 hours No results found for this basename: AMMONIA,  in the last 168 hours CBC:  Recent Labs Lab 06/14/14 1930 06/15/14 0233  WBC 7.8 7.6  HGB 14.6 14.7  HCT 42.8 41.9  MCV 86.6 85.0  PLT 350 349   Cardiac Enzymes:  Recent Labs Lab 06/15/14 0233 06/15/14 0801  CKTOTAL 449*  --   CKMB 3.9  --   TROPONINI <0.30 <0.30   BNP: BNP (last 3 results) No results found for this basename: PROBNP,  in the last 8760 hours CBG: No results found for this basename: GLUCAP,  in the last 168 hours     Signed:  Louellen Molder  [facility] 06/15/2014, 2:12 PM

## 2014-06-15 NOTE — Progress Notes (Signed)
Patient transferred to 3E16 from ED via stretcher.  Alert and Oriented x4.  Denies pain.  Telemetry monitor applied and CCMD notified of admission. RN will continue to monitor. Shellee Milo, RN

## 2014-06-15 NOTE — Telephone Encounter (Signed)
Jerry Garcia, pt was just disch today, fourteen 1 mg tabs , drowsy prec, one up to bid prn anxiety

## 2014-06-15 NOTE — Progress Notes (Signed)
All d/c instructions explained and given to pt.  Verbalized understanding. .  Nadja Lina, RN. 

## 2014-06-16 LAB — ANA: ANA: NEGATIVE

## 2014-06-16 LAB — PARATHYROID HORMONE, INTACT (NO CA): PTH: 46 pg/mL (ref 14–64)

## 2014-06-16 NOTE — Telephone Encounter (Signed)
Rx faxed to pharmacy. Patient notified. 

## 2014-06-17 LAB — ROCKY MTN SPOTTED FVR AB, IGG-BLOOD: RMSF IGG: 0.12 IV

## 2014-06-17 LAB — B. BURGDORFI ANTIBODIES BY WB
B BURGDORFERI IGG ABS (IB): NEGATIVE
B burgdorferi IgM Abs (IB): NEGATIVE

## 2014-06-17 LAB — ROCKY MTN SPOTTED FVR AB, IGM-BLOOD: RMSF IGM: 0.15 IV (ref 0.00–0.89)

## 2014-06-20 ENCOUNTER — Ambulatory Visit (INDEPENDENT_AMBULATORY_CARE_PROVIDER_SITE_OTHER): Payer: 59 | Admitting: Neurology

## 2014-06-20 ENCOUNTER — Encounter (INDEPENDENT_AMBULATORY_CARE_PROVIDER_SITE_OTHER): Payer: Self-pay

## 2014-06-20 DIAGNOSIS — R253 Fasciculation: Secondary | ICD-10-CM

## 2014-06-20 DIAGNOSIS — Z0289 Encounter for other administrative examinations: Secondary | ICD-10-CM

## 2014-06-20 NOTE — Progress Notes (Addendum)
    [facility]  Provider:  Dr Jaynee Eagles Referring Provider: Mikey Kirschner, MD Primary Care Physician:  Rubbie Battiest, MD  History: 43 year old patient referred for muscle twitching. He was seen in urgent care last week because he didn't feel right, was found to be hypertensive and tachycardic and admitted overnight. He reports twitching in his lateral upper left thigh 2 weeks ago. A week ago saw some left biceps twitching. Then experienced twitching in the left thorax. No weakness. No numbness or tingling. No difficulty swallowing, no tremors, no vision changes. No PBA. No fhx of neuromuscular disorders. Stopped caffeine and the twitches are better, was a "starbucks-aholic". Quit a week ago and twitches have winded down. Was drinking a lot of caffeine daily. Focused exam demonstrates normal strengh, symmetric normal reflexes, downgoing toes, PERRL, EOMI, face symmtric with good facial strength.   Summary: Nerve Conduction studies were performed on the left upper and left lower extremities.   Ulnar, Median, Peroneal and Tibial motor conductions were within normal limits with normal F wave latencies.  Median, Ulnar and Sural sensory conductions were within normal limits.  H Reflex within normal limits  EMG needle evaluation was performed on selected left extremity muscles: The Deltoid, Biceps, Triceps, Pronator Teres, First Dorsal Interoseous, Vastus Lateralis, Anterior Tibialis, Medial Gastrocnemius, Extensor Hallucis Longus and Abductor Hallucis muscles were within normal limits. No fasciculations appreciated.   Conclusion: This is a normal study. No electrophysiologic evidence for neuropathy or neuromuscular disorders. No fasciculations seen on clinical exam or on EMG. 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   Sarina Ill, MD  [facility] [address] [place], [place] [address]  Phone [phone] Fax [phone] Lenor Coffin

## 2014-06-22 ENCOUNTER — Ambulatory Visit (INDEPENDENT_AMBULATORY_CARE_PROVIDER_SITE_OTHER): Payer: 59 | Admitting: Family Medicine

## 2014-06-22 ENCOUNTER — Encounter: Payer: Self-pay | Admitting: Family Medicine

## 2014-06-22 VITALS — BP 130/72 | Ht 70.0 in | Wt 205.5 lb

## 2014-06-22 DIAGNOSIS — F411 Generalized anxiety disorder: Secondary | ICD-10-CM

## 2014-06-22 DIAGNOSIS — R000 Tachycardia, unspecified: Secondary | ICD-10-CM

## 2014-06-22 DIAGNOSIS — K219 Gastro-esophageal reflux disease without esophagitis: Secondary | ICD-10-CM

## 2014-06-22 MED ORDER — LORAZEPAM 1 MG PO TABS: 1.0000 mg | ORAL_TABLET | Freq: Every day | ORAL | Status: DC | PRN

## 2014-06-22 MED ORDER — ESCITALOPRAM OXALATE 10 MG PO TABS: 10.0000 mg | ORAL_TABLET | Freq: Every day | ORAL | Status: DC

## 2014-06-22 NOTE — Progress Notes (Signed)
   Subjective:    Patient ID: Jerry Garcia, male    DOB: 1970/12/19, 43 y.o.   MRN: [id number]  HPI Patient is here for a hospitalization follow up visit. Patient was admitted to [facility] on 06/14/14 for tachycardia. Patient states that CT scan, xrays and echo was done while in the hospital. Results in the system.   Patient states that he feels a little bit better. Patient states that he is still anxious and very nervous. Patient admits that he drinks a lot of caffeine daily.    Patient states that he has a knot on the back of his head that has been present for about 3-4 weeks.   uc flared up last wk, pt took therapy for a few days and now better  Patient admits to considerable feelings of anxiety. Nervousness. Somewhat irritable at times. Has had a long history this worse recently.  Was evaluated with HEENT for neuromuscular disorder just 2 days ago and this returned normal. Review of Systems No headache no chest pain no back pain no change about habits no blood in stool ROS otherwise negative    Objective:   Physical Exam  Alert vitals stable HEENT normal. Lungs clear. Heart rare rhythm. Right posterior chain reactive lymph node no tenderness. Patient notes recent irritation on scalp Entire hospital chart reviewed in presents the patient      Assessment & Plan:  Impression #1 tachycardia likely due to excess caffeine plus #2 #2 flare of chronic anxiety. #3 posterior reactive lymph node within normal limits plan diet exercise discussed. Decreased caffeine. Ativan when necessary. Drowsy precautions discussed. Initiate Lexapro. Rationale discussed recheck in 3 months. Exercise encouraged WSL

## 2014-06-23 NOTE — ED Provider Notes (Signed)
Medical screening examination/treatment/procedure(s) were performed by non-physician practitioner and as supervising physician I was immediately available for consultation/collaboration.   EKG Interpretation   Date/Time:  Tuesday June 14 2014 19:36:19 EDT Ventricular Rate:  110 PR Interval:  121 QRS Duration: 98 QT Interval:  334 QTC Calculation: 452 R Axis:   65 Text Interpretation:  Sinus tachycardia Artifact No old tracing to compare  Confirmed by [facility]  MD, Nunzio Cory [phone]) on 06/14/2014 7:57:16 PM        Francine Graven, DO 06/23/14 0725

## 2014-06-26 DIAGNOSIS — F411 Generalized anxiety disorder: Secondary | ICD-10-CM | POA: Insufficient documentation

## 2014-09-08 ENCOUNTER — Other Ambulatory Visit: Payer: Self-pay | Admitting: Family Medicine

## 2014-09-08 NOTE — Telephone Encounter (Signed)
May ref times one 

## 2014-09-20 ENCOUNTER — Encounter: Payer: Self-pay | Admitting: Neurology

## 2014-09-20 ENCOUNTER — Ambulatory Visit (INDEPENDENT_AMBULATORY_CARE_PROVIDER_SITE_OTHER): Payer: 59 | Admitting: Neurology

## 2014-09-20 VITALS — BP 119/78 | HR 112 | Ht 70.0 in | Wt 211.4 lb

## 2014-09-20 DIAGNOSIS — G4700 Insomnia, unspecified: Secondary | ICD-10-CM

## 2014-09-20 NOTE — Patient Instructions (Signed)
Overall you are doing fairly well but I do want to suggest a few things today:   Remember to drink plenty of fluid, eat healthy meals and do not skip any meals. Try to eat protein with a every meal and eat a healthy snack such as fruit or nuts in between meals. Try to keep a regular sleep-wake schedule and try to exercise daily, particularly in the form of walking, 20-30 minutes a day, if you can.   As far as your medications are concerned, I would like to suggest: can try taking melatonin 5mg  an hour or two before bed, agree with weaning ativan, can consider increasing lexapro and f/u with psychiatry as well as getting therapy/biofeedback, maybe your pcp will have references in your area  Please call us with any interim questions, concerns, problems, updates or refill requests.   Please also call us for any test results so we can go over those with you on the phone.  My clinical assistant and will answer any of your questions and relay your messages to me and also relay most of my messages to you.   Our phone number is [phone]. We also have an after hours call service for urgent matters and there is a physician on-call for urgent questions. For any emergencies you know to call 911 or go to the nearest emergency room

## 2014-09-20 NOTE — Progress Notes (Addendum)
[name]    Provider:  Dr Jaynee Eagles Referring Provider: Mikey Kirschner, MD Primary Care Physician:  Rubbie Battiest, MD  CC:  Muscle twitching  HPI:  Jerry Garcia is a 44 y.o. male here as a referral from Dr. Wolfgang Phoenix for muscle twitching. 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 He reports twitching in his lateral upper left thigh,l eft biceps twitching, left thorax. Symptoms started at the end of September (about 3 months ago).  No weakness. No numbness or tingling. No difficulty swallowing, no tremors, no vision changes. No PBA. No fhx of neuromuscular disorders. Stopped caffeine and the twitches are better, was a "starbucks-aholic".   He is still having some twitching but nothing significant. Basicaly resolved since stopping caffeine. Dr. Wolfgang Phoenix put him on Lexapro and ativan. He went back to see Dr. Wolfgang Phoenix and he wanted to try and wean him off of the ativan. But when he doesn't take it, he can't rest at night, gets jittery in his chest. He has not seen a psychiatrist. He is not sad most days, no changes in weight, no changes in concentration, no changes in hobbies or activities. But he worries a lot, mostly due to his mother being in bad health. He is not sure where the anxiety is coming from. He has anxious feeling inside all the time, a "nervous feeling in my chest", worsening. Denies headaches, weakness, paresthesias or any focal neurologic issue. The fascics have really improved, last time he felt them was a little bit 2 weeks ago. He is taking ativan 34m to sleep. He sleeps well at night. If he doesn't take it, the next evening he doesn't get any rest. Doesn't want to increase his Lexapro due to the side effects but is not having any side effects right now.   Reviewed notes, labs and imaging from outside physicians, which showed: EMg/NCS performed in October was normal. CK 357, Lyme agm/igg negative, RMSF IgG neg, mag nml,  Urine drug screen  negative, bmp wnl, cbc wnl, ana neg, ldh wnl, lactic acid wnl, tsh wnl. CT of the head in 2004: negative noncontrast head CT  Review of Systems: Patient complains of symptoms per HPI as well as the following symptoms: nervous/anxious. Pertinent negatives per HPI. All others negative.   History   Social History  . Marital Status: Single    Spouse Name: N/A    Number of Children: 0  . Years of Education: N/A   Occupational History  . mail clerk    Social History Main Topics  . Smoking status: Never Smoker   . Smokeless tobacco: Never Used  . Alcohol Use: No  . Drug Use: No  . Sexual Activity: Not on file   Other Topics Concern  . Not on file   Social History Narrative   Patient lives at home with mother LBarbara Garcia   Patient is right handed    Patient is single   Patient has no children     Family History  Problem Relation Age of Onset  . Colon cancer Neg Hx   . Stomach cancer Paternal Uncle   . Hypertension Mother   . Diabetes Mother   . Cancer Mother   . Alzheimer's disease Father     Past Medical History  Diagnosis Date  . Ulcerative colitis   . Chronic right hip pain     Past Surgical History  Procedure Laterality Date  . Nasal sinus surgery    .  Hernia repair  1694    umbilical  . Colonoscopy  04/30/2013    Current Outpatient Prescriptions  Medication Sig Dispense Refill  . acetaminophen (TYLENOL) 325 MG tablet Take 650 mg by mouth every 6 (six) hours as needed for moderate pain.    Marland Kitchen escitalopram (LEXAPRO) 10 MG tablet Take 1 tablet (10 mg total) by mouth daily. 30 tablet 5  . LORazepam (ATIVAN) 1 MG tablet TAKE ONE TABLET BY MOUTH ONCE DAILY AS NEEDED FOR ANXIETY 24 tablet 0  . mesalamine (CANASA) 1000 MG suppository Place 1 suppository (1,000 mg total) rectally at bedtime. 30 suppository 2  . psyllium (METAMUCIL) 58.6 % powder Take 1 packet by mouth daily.      No current facility-administered medications for this visit.    Allergies as of  09/20/2014 - Review Complete 09/20/2014  Allergen Reaction Noted  . Oxycodone-acetaminophen Other (See Comments)     Vitals: BP 119/78 mmHg  Pulse 112  Ht 5' 10"  (1.778 m)  Wt 211 lb 6.4 oz (95.89 kg)  BMI 30.33 kg/m2 Last Weight:  Wt Readings from Last 1 Encounters:  09/20/14 211 lb 6.4 oz (95.89 kg)   Last Height:   Ht Readings from Last 1 Encounters:  09/20/14 5' 10"  (1.778 m)    Physical exam: Exam: Gen: NAD, conversant, well nourised, well groomed                     CV: RRR, no MRG. No Carotid Bruits. No peripheral edema, warm, nontender Eyes: Conjunctivae clear without exudates or hemorrhage  Neuro: Detailed Neurologic Exam  Speech:    Speech is normal; fluent and spontaneous with normal comprehension.  Cognition:    The patient is oriented to person, place, and time;     recent and remote memory intact;     language fluent;     normal attention, concentration,     fund of knowledge Cranial Nerves:    The pupils are equal, round, and reactive to light. The fundi are normal and spontaneous venous pulsations are present. Visual fields are full to finger confrontation. Extraocular movements are intact. Trigeminal sensation is intact and the muscles of mastication are normal. The face is symmetric. The palate elevates in the midline. Hearing intact. Voice is normal. Shoulder shrug is normal. The tongue has normal motion without fasciculations.   Coordination:    Normal finger to nose and heel to shin. Normal rapid alternating movements.   Gait:    Heel-toe and tandem gait are normal.   Motor Observation:    No asymmetry, no atrophy, and no involuntary movements noted. No fasciculations seen. Tone:    Normal muscle tone.    Posture:    Posture is normal. normal erect    Strength:    Strength is V/V in the upper and lower limbs.      Sensation: intact     Reflex Exam:  DTR's:    Deep tendon reflexes in the upper and lower extremities are normal  bilaterally.   Toes:    The toes are downgoing bilaterally.   Clonus:    Clonus is absent.      Assessment/Plan:  44 year old male with benign fasciculations. They have resolved with decrease in caffeine. He reports significant anxiety and insombia  Insomnia: Agree with weaning ativan and suggest therapy/biofeedback for anxiety, Can try melatonin 21m an hour or 2 before bed, Good sleep hygiene, Avoid caffeinated products  Significant Anxiety: Suggest increasing lexapro and f/u  with psychiatry as well as getting therapy/biofeedback. Your pcp will have references in your area to therapy  Benign Fasciculations: Avoid caffeine.     Sarina Ill, MD  [facility] [address] [place], [place] [address]  Phone [phone] Fax [phone]  A total of 30 minutes was spent in with this patient. Over half this time was spent on counseling patient on the diagnosis and different therapeutic options available.

## 2014-09-24 DIAGNOSIS — G4700 Insomnia, unspecified: Secondary | ICD-10-CM | POA: Insufficient documentation

## 2015-01-06 ENCOUNTER — Other Ambulatory Visit: Payer: Self-pay | Admitting: Family Medicine

## 2015-02-02 ENCOUNTER — Other Ambulatory Visit: Payer: Self-pay | Admitting: Family Medicine

## 2015-02-03 NOTE — Telephone Encounter (Signed)
Needs office visit.

## 2015-02-24 ENCOUNTER — Other Ambulatory Visit: Payer: Self-pay | Admitting: Family Medicine

## 2015-03-03 ENCOUNTER — Encounter: Payer: Self-pay | Admitting: Internal Medicine

## 2015-03-17 ENCOUNTER — Other Ambulatory Visit: Payer: Self-pay | Admitting: Family Medicine

## 2015-03-20 ENCOUNTER — Telehealth: Payer: Self-pay | Admitting: Family Medicine

## 2015-03-20 ENCOUNTER — Other Ambulatory Visit: Payer: Self-pay | Admitting: *Deleted

## 2015-03-20 MED ORDER — ESCITALOPRAM OXALATE 10 MG PO TABS: 10.0000 mg | ORAL_TABLET | Freq: Every day | ORAL | Status: DC

## 2015-03-20 NOTE — Telephone Encounter (Signed)
Med send to pharm. Pt notified.

## 2015-03-20 NOTE — Telephone Encounter (Signed)
Pt will need around an additional 7 days to his escitalopram (LEXAPRO) 10 MG tablet   He has an appt on 7/28 the first available but he will be out before this appt   Thanks

## 2015-03-30 ENCOUNTER — Other Ambulatory Visit: Payer: Self-pay | Admitting: Family Medicine

## 2015-03-30 NOTE — Telephone Encounter (Signed)
Needs office visit.

## 2015-03-30 NOTE — Telephone Encounter (Signed)
May have 30 day prescription, please send a card to the patient's AV no further refills until seen

## 2015-04-06 ENCOUNTER — Ambulatory Visit (INDEPENDENT_AMBULATORY_CARE_PROVIDER_SITE_OTHER): Payer: 59 | Admitting: Family Medicine

## 2015-04-06 ENCOUNTER — Encounter: Payer: Self-pay | Admitting: Family Medicine

## 2015-04-06 VITALS — BP 130/80 | Ht 70.0 in | Wt 215.4 lb

## 2015-04-06 DIAGNOSIS — M25551 Pain in right hip: Secondary | ICD-10-CM | POA: Diagnosis not present

## 2015-04-06 DIAGNOSIS — R000 Tachycardia, unspecified: Secondary | ICD-10-CM | POA: Diagnosis not present

## 2015-04-06 DIAGNOSIS — K219 Gastro-esophageal reflux disease without esophagitis: Secondary | ICD-10-CM

## 2015-04-06 DIAGNOSIS — F411 Generalized anxiety disorder: Secondary | ICD-10-CM

## 2015-04-06 DIAGNOSIS — R7301 Impaired fasting glucose: Secondary | ICD-10-CM

## 2015-04-06 DIAGNOSIS — G4700 Insomnia, unspecified: Secondary | ICD-10-CM | POA: Diagnosis not present

## 2015-04-06 MED ORDER — ESCITALOPRAM OXALATE 10 MG PO TABS: 10.0000 mg | ORAL_TABLET | Freq: Every day | ORAL | Status: DC

## 2015-04-06 MED ORDER — LORAZEPAM 1 MG PO TABS: ORAL_TABLET | ORAL | Status: DC

## 2015-04-06 NOTE — Progress Notes (Signed)
   Subjective:    Patient ID: Jerry Garcia, male    DOB: [birth year], 44 y.o.   MRN: [id number]  HPI  Patient arrives for a follow up on depression and anxiety. No problems or concerns.  No twitching at all, neuromuscular twitching has nearly resolved. Neurology note reviewed. Felt to be benign at that time.  Not exercising these days  Sleep overall beter  Using lexapro qhs and taking regulaly   Mood is great  Heart still races at times. Generally not associated with chest pain. Saw the cardiologist. Please see prior note.  Compliant with when necessary Ativan. Does help him sleep. Insomnia is a big issue. Next  States mood overall is better.  No suicidal or homicidal thoughts      Review of Systems No headache no chest pain no back pain no abdominal pain    Objective:   Physical Exam  Alert vitals stable no acute distress. HEENT normal. Lungs clear. Heart regular in rhythm. Heart rate approximately 90. No abnormal murmurs. No tremor      Assessment & Plan:  Impression 1 tachycardia some. #2 muscle fasciculations likely benign. #3 insomnia ongoing #4 depression fair control plan diet discussed exercise discussed maintain all medications. Recheck in 6 months. WSL

## 2015-10-09 ENCOUNTER — Encounter: Payer: Self-pay | Admitting: Family Medicine

## 2015-10-09 ENCOUNTER — Ambulatory Visit (INDEPENDENT_AMBULATORY_CARE_PROVIDER_SITE_OTHER): Payer: 59 | Admitting: Family Medicine

## 2015-10-09 VITALS — BP 134/90 | Ht 70.0 in | Wt 223.2 lb

## 2015-10-09 DIAGNOSIS — K219 Gastro-esophageal reflux disease without esophagitis: Secondary | ICD-10-CM | POA: Diagnosis not present

## 2015-10-09 DIAGNOSIS — F411 Generalized anxiety disorder: Secondary | ICD-10-CM | POA: Diagnosis not present

## 2015-10-09 DIAGNOSIS — G4700 Insomnia, unspecified: Secondary | ICD-10-CM

## 2015-10-09 DIAGNOSIS — R000 Tachycardia, unspecified: Secondary | ICD-10-CM | POA: Diagnosis not present

## 2015-10-09 MED ORDER — PANTOPRAZOLE SODIUM 40 MG PO TBEC: DELAYED_RELEASE_TABLET | ORAL | Status: DC

## 2015-10-09 MED ORDER — LORAZEPAM 1 MG PO TABS
ORAL_TABLET | ORAL | Status: DC
Start: 2015-10-09 — End: 2016-06-11

## 2015-10-09 MED ORDER — ESCITALOPRAM OXALATE 10 MG PO TABS: 10.0000 mg | ORAL_TABLET | Freq: Every day | ORAL | Status: DC

## 2015-10-09 NOTE — Progress Notes (Signed)
   Subjective:    Patient ID: Jerry Garcia, male    DOB: June 15, 1971, 45 y.o.   MRN: [id number] Patient arrives office with several different concerns Gastroesophageal Reflux This is a chronic problem. The current episode started more than 1 year ago. Treatments tried: Metamucil.   Patient would like a to restart Pantoprazole 40 mg. Still a major aggravation. Uses tums regulaly , comes and goes, tends to hit more in the evening, and then when he lays down.  Concerned about rapid heart rate persists. See prior notes. Has had mega-workup including admission to the hospital negative echo CT telemetry x-rays etc.   Zero exercise and no motivation., though claims no depression  Use ativan prn  And lexapro daily. States a lot of trouble sleeping. Feels he needs Ativan every night. Does agree the Lexapro is helping. Meds reviewed today. No obvious side effects.  Feeling need for sleep , uses ativan most nights.  Review of Systems    no headache no chest pain no back pain no abdominal pain no change in bowel habits Objective:   Physical Exam  Alert mild malaise vitals stable. Blood pressure good on repeat. HEENT normal. Lungs clear. Heart regular in rhythm. Abdomen benign.      Assessment & Plan:  Impression #1 chronic anxiety discussed with need for Lexapro. Also element of depression stable control #2 insomnia ongoing discussed needs meds #3 reflux. Worsening. Needs prescription medicine discussed to use just when necessary #4 increased heart rate. Patient states he worries about this. Nothing is really more exercise intolerance. Plus as had negative workup already plan all medications refilled. Medicines written. Diet exercise discussed. Recheck every 6 months WSL

## 2015-12-05 ENCOUNTER — Other Ambulatory Visit: Payer: Self-pay | Admitting: Family Medicine

## 2016-03-04 ENCOUNTER — Other Ambulatory Visit: Payer: Self-pay | Admitting: Family Medicine

## 2016-04-08 ENCOUNTER — Ambulatory Visit: Payer: 59 | Admitting: Family Medicine

## 2016-05-08 ENCOUNTER — Ambulatory Visit (HOSPITAL_COMMUNITY)
Admission: EM | Admit: 2016-05-08 | Discharge: 2016-05-08 | Disposition: A | Discharge status: Home / Self Care | Payer: 59 | Attending: Emergency Medicine | Admitting: Emergency Medicine

## 2016-05-08 ENCOUNTER — Encounter (HOSPITAL_COMMUNITY): Payer: Self-pay | Admitting: Emergency Medicine

## 2016-05-08 DIAGNOSIS — B001 Herpesviral vesicular dermatitis: Secondary | ICD-10-CM | POA: Diagnosis not present

## 2016-05-08 DIAGNOSIS — H109 Unspecified conjunctivitis: Secondary | ICD-10-CM | POA: Diagnosis not present

## 2016-05-08 DIAGNOSIS — B349 Viral infection, unspecified: Secondary | ICD-10-CM

## 2016-05-08 DIAGNOSIS — R21 Rash and other nonspecific skin eruption: Secondary | ICD-10-CM

## 2016-05-08 MED ORDER — HYDROXYZINE HCL 25 MG PO TABS: 25.0000 mg | ORAL_TABLET | Freq: Four times a day (QID) | ORAL | 0 refills | Status: DC

## 2016-05-08 MED ORDER — POLYMYXIN B-TRIMETHOPRIM 10000-0.1 UNIT/ML-% OP SOLN: 2.0000 [drp] | OPHTHALMIC | 0 refills | Status: DC

## 2016-05-08 MED ORDER — KETOROLAC TROMETHAMINE 60 MG/2ML IM SOLN: 60.0000 mg | Freq: Once | INTRAMUSCULAR | Status: DC

## 2016-05-08 MED ORDER — VALACYCLOVIR HCL 1 G PO TABS: ORAL_TABLET | ORAL | 0 refills | Status: DC

## 2016-05-08 MED ORDER — METHYLPREDNISOLONE 4 MG PO TBPK: ORAL_TABLET | ORAL | 0 refills | Status: DC

## 2016-05-08 NOTE — ED Triage Notes (Signed)
Rash that started yesterday morning.  Patient has used benadryl.

## 2016-05-09 ENCOUNTER — Encounter: Payer: 59 | Admitting: Family Medicine

## 2016-05-09 NOTE — ED Provider Notes (Signed)
CSN: [id number]     Arrival date & time 05/08/16  1722 History   None    Chief Complaint  Patient presents with  . Rash   (Consider location/radiation/quality/duration/timing/severity/associated sxs/prior Treatment) Patient c/o cancre sores, congestion, uri sx's, red eyes bilateral with drainage, and rash from neck down.  He has had these problems for last 2 days.   The history is provided by the patient.  Rash  Location:  Full body Quality: itchiness and redness   Severity:  Moderate Onset quality:  Sudden Duration:  2 days Timing:  Constant Progression:  Spreading Chronicity:  New Relieved by:  Nothing Worsened by:  Nothing Ineffective treatments:  None tried Associated symptoms: myalgias     Past Medical History:  Diagnosis Date  . Chronic right hip pain   . Ulcerative colitis    Past Surgical History:  Procedure Laterality Date  . COLONOSCOPY  04/30/2013  . HERNIA REPAIR  [id number]   umbilical  . NASAL SINUS SURGERY     Family History  Problem Relation Age of Onset  . Colon cancer Neg Hx   . Stomach cancer Paternal Uncle   . Hypertension Mother   . Diabetes Mother   . Cancer Mother   . Alzheimer's disease Father    Social History  Substance Use Topics  . Smoking status: Never Smoker  . Smokeless tobacco: Never Used  . Alcohol use No    Review of Systems  Constitutional: Negative.   HENT: Positive for rhinorrhea and sneezing.   Eyes: Positive for redness and itching.  Respiratory: Negative.   Cardiovascular: Negative.   Gastrointestinal: Negative.   Endocrine: Negative.   Genitourinary: Negative.   Musculoskeletal: Positive for myalgias.  Skin: Positive for rash.  Allergic/Immunologic: Negative.   Neurological: Negative.   Psychiatric/Behavioral: Negative.     Allergies  Oxycodone-acetaminophen  Home Medications   Prior to Admission medications   Medication Sig Start Date End Date Taking? Authorizing Provider  acetaminophen (TYLENOL) 325 MG  tablet Take 650 mg by mouth every 6 (six) hours as needed for moderate pain.    Historical Provider, MD  escitalopram (LEXAPRO) 10 MG tablet TAKE 1 TABLET (10 MG TOTAL) BY MOUTH DAILY. 03/05/16   Mikey Kirschner, MD  hydrOXYzine (ATARAX/VISTARIL) 25 MG tablet Take 1 tablet (25 mg total) by mouth every 6 (six) hours. 05/08/16   Lysbeth Penner, FNP  LORazepam (ATIVAN) 1 MG tablet TAKE ONE TABLET BY MOUTH ONCE DAILY AS NEEDED FOR ANXIETY 10/09/15   Mikey Kirschner, MD  mesalamine (CANASA) 1000 MG suppository Place 1 suppository (1,000 mg total) rectally at bedtime. Patient not taking: Reported on 10/09/2015 06/14/14   Irene Shipper, MD  methylPREDNISolone (MEDROL DOSEPAK) 4 MG TBPK tablet Take 6-5-4-3-2-1 po qd 05/08/16   Lysbeth Penner, FNP  pantoprazole (PROTONIX) 40 MG tablet Take 1 tablet by mouth every morning. 10/09/15   Mikey Kirschner, MD  psyllium (METAMUCIL) 58.6 % powder Take 1 packet by mouth daily.     Historical Provider, MD  trimethoprim-polymyxin b (POLYTRIM) ophthalmic solution Place 2 drops into both eyes every 4 (four) hours. 05/08/16   Lysbeth Penner, FNP  valACYclovir (VALTREX) 1000 MG tablet Take 2 po bid x 1 days 05/08/16   Lysbeth Penner, FNP   Meds Ordered and Administered this Visit  Medications - No data to display  BP 157/87   Pulse 82   Temp 98 F (36.7 C) (Oral)   Resp 20   SpO2 100%  No data found.   Physical Exam  Constitutional: He appears well-developed and well-nourished.  HENT:  Head: Normocephalic and atraumatic.  Right Ear: External ear normal.  Left Ear: External ear normal.  Nose: Nose normal.  Mouth/Throat: Oropharynx is clear and moist.  Eyes: EOM are normal. Pupils are equal, round, and reactive to light. Right eye exhibits discharge. Left eye exhibits discharge.  Neck: Normal range of motion. Neck supple.  Cardiovascular: Normal rate, regular rhythm and normal heart sounds.   Pulmonary/Chest: Effort normal and breath sounds normal.   Abdominal: Soft. Bowel sounds are normal.  Nursing note and vitals reviewed.   Urgent Care Course   Clinical Course    Procedures (including critical care time)  Labs Review Labs Reviewed - No data to display  Imaging Review No results found.   Visual Acuity Review  Right Eye Distance:   Left Eye Distance:   Bilateral Distance:    Right Eye Near:   Left Eye Near:    Bilateral Near:         MDM   1. Cold sore   2. Bilateral conjunctivitis   3. Rash   4. Viral syndrome    Valtrex 2gm po bid x 1 day #4 Polytrim eye gtt's q 2 hours #12ml Hydroxyzine 25mg  po q 6 hours prn #21 Medrol dose pack as directed 4mg  #21      Lysbeth Penner, FNP 05/09/16 [place], [place] 05/09/16 1026

## 2016-05-17 ENCOUNTER — Telehealth: Payer: Self-pay | Admitting: Internal Medicine

## 2016-05-20 MED ORDER — MESALAMINE 1000 MG RE SUPP: 1000.0000 mg | Freq: Every day | RECTAL | 2 refills | Status: DC

## 2016-05-20 NOTE — Telephone Encounter (Signed)
Canasa refilled

## 2016-05-27 ENCOUNTER — Other Ambulatory Visit: Payer: Self-pay | Admitting: Family Medicine

## 2016-06-11 ENCOUNTER — Encounter: Payer: Self-pay | Admitting: Family Medicine

## 2016-06-11 ENCOUNTER — Ambulatory Visit (INDEPENDENT_AMBULATORY_CARE_PROVIDER_SITE_OTHER): Payer: 59 | Admitting: Family Medicine

## 2016-06-11 VITALS — BP 134/90 | Ht 69.25 in | Wt 222.5 lb

## 2016-06-11 DIAGNOSIS — Z0000 Encounter for general adult medical examination without abnormal findings: Secondary | ICD-10-CM

## 2016-06-11 DIAGNOSIS — K219 Gastro-esophageal reflux disease without esophagitis: Secondary | ICD-10-CM | POA: Diagnosis not present

## 2016-06-11 DIAGNOSIS — Z13220 Encounter for screening for lipoid disorders: Secondary | ICD-10-CM | POA: Diagnosis not present

## 2016-06-11 DIAGNOSIS — F411 Generalized anxiety disorder: Secondary | ICD-10-CM

## 2016-06-11 DIAGNOSIS — Z79899 Other long term (current) drug therapy: Secondary | ICD-10-CM

## 2016-06-11 MED ORDER — ESCITALOPRAM OXALATE 10 MG PO TABS: ORAL_TABLET | ORAL | 5 refills | Status: DC

## 2016-06-11 NOTE — Progress Notes (Signed)
   Subjective:    Patient ID: Jerry Garcia, male    DOB: 29-Oct-1970, 45 y.o.   MRN: [id number]  HPI The patient comes in today for a wellness visit.    A review of their health history was completed.  A review of medications was also completed.  Any needed refills: none  Eating habits: good  Falls/  MVA accidents in past few months: none  Regular exercise: yes  Specialist pt sees on regular basis: none  Preventative health issues were discussed.   Additional concerns: none  Patient gets flu shot from his employer for free.   Dr Scarlette Shorts, rec  Reflux overall stable lexapro overal controlling anxiety and mood well,  Not exercising any  not sall that great with diet, not exdrcising   On feet mos of the day, and constantly walking   Patient notes ongoing compliance with antidepressant medication. No obvious side effects. Reports does not miss a dose. Overall continues to help depression substantially. No thoughts of homicide or suicide. Would like to maintain medication. Also substantial element of anxiety and patient reports medication is definitely helped.     Review of Systems  Constitutional: Negative.  Negative for activity change, appetite change and fever.  HENT: Negative for congestion and rhinorrhea.   Eyes: Negative for discharge.  Respiratory: Negative for cough and wheezing.   Cardiovascular: Negative for chest pain.  Gastrointestinal: Negative for abdominal pain, blood in stool and vomiting.  Genitourinary: Negative for difficulty urinating and frequency.  Musculoskeletal: Negative for neck pain.  Skin: Negative for rash.  Allergic/Immunologic: Negative for environmental allergies and food allergies.  Neurological: Negative for weakness and headaches.  Psychiatric/Behavioral: Negative for agitation.  All other systems reviewed and are negative.      Objective:   Physical Exam  Constitutional: He appears well-developed and well-nourished.    HENT:  Head: Normocephalic and atraumatic.  Right Ear: External ear normal.  Left Ear: External ear normal.  Nose: Nose normal.  Mouth/Throat: Oropharynx is clear and moist.  Eyes: EOM are normal. Pupils are equal, round, and reactive to light.  Neck: Normal range of motion. Neck supple. No thyromegaly present.  Cardiovascular: Normal rate, regular rhythm and normal heart sounds.   No murmur heard. Pulmonary/Chest: Effort normal and breath sounds normal. No respiratory distress. He has no wheezes.  Abdominal: Soft. Bowel sounds are normal. He exhibits no distension and no mass. There is no tenderness.  Genitourinary: Penis normal.  Musculoskeletal: Normal range of motion. He exhibits no edema.  Lymphadenopathy:    He has no cervical adenopathy.  Neurological: He is alert. He exhibits normal muscle tone.  Skin: Skin is warm and dry. No erythema.  Psychiatric: He has a normal mood and affect. His behavior is normal. Judgment normal.  Vitals reviewed.         Assessment & Plan:  Impression 1 well adult exam #2 history of ulcerative colitis is not seen GI doctor for 2 and half years encouraged to see him #3 reflux fair control #4 obesity gradually worsening #5 depression/anxiety stable on current medications. To maintain same. Plan diet exercise discussed. Appropriate blood work. Medications refilled. Recheck in 6 months. WSL

## 2016-06-12 LAB — BASIC METABOLIC PANEL
BUN / CREAT RATIO: 13 (ref 9–20)
BUN: 13 mg/dL (ref 6–24)
CALCIUM: 9.8 mg/dL (ref 8.7–10.2)
CHLORIDE: 99 mmol/L (ref 96–106)
CO2: 25 mmol/L (ref 18–29)
Creatinine, Ser: 0.99 mg/dL (ref 0.76–1.27)
GFR calc Af Amer: 104 mL/min/{1.73_m2} (ref >59)
GFR calc non Af Amer: 90 mL/min/{1.73_m2} (ref >59)
GLUCOSE: 122 mg/dL — AB (ref 65–99)
POTASSIUM: 4.8 mmol/L (ref 3.5–5.2)
Sodium: 139 mmol/L (ref 134–144)

## 2016-06-12 LAB — HEPATIC FUNCTION PANEL
ALK PHOS: 86 IU/L (ref 39–117)
ALT: 60 IU/L — AB (ref 0–44)
AST: 37 IU/L (ref 0–40)
Albumin: 4.8 g/dL (ref 3.5–5.5)
BILIRUBIN, DIRECT: 0.13 mg/dL (ref 0.00–0.40)
Bilirubin Total: 0.4 mg/dL (ref 0.0–1.2)
TOTAL PROTEIN: 7.7 g/dL (ref 6.0–8.5)

## 2016-06-12 LAB — LIPID PANEL
CHOL/HDL RATIO: 3.1 ratio (ref 0.0–5.0)
Cholesterol, Total: 211 mg/dL — ABNORMAL HIGH (ref 100–199)
HDL: 67 mg/dL (ref >39)
LDL Calculated: 127 mg/dL — ABNORMAL HIGH (ref 0–99)
TRIGLYCERIDES: 87 mg/dL (ref 0–149)
VLDL CHOLESTEROL CAL: 17 mg/dL (ref 5–40)

## 2016-06-16 ENCOUNTER — Encounter: Payer: Self-pay | Admitting: Family Medicine

## 2016-07-11 ENCOUNTER — Telehealth: Payer: Self-pay | Admitting: Family Medicine

## 2016-07-11 NOTE — Telephone Encounter (Signed)
Patient faxed a form for Dr. Richardson Landry to complete based on his last physical.   Please fax to the number on the top of the form when complete.

## 2016-07-11 NOTE — Telephone Encounter (Signed)
Form filled out and just needs signature. Form in your basket.

## 2016-07-12 NOTE — Telephone Encounter (Signed)
This was faxed by Danae Chen.

## 2016-07-28 IMAGING — CR DG CHEST 2V
2 series · 2 of 2 positions shown · non-contrast
Comparison: None.

CLINICAL DATA: Left-sided chest pain for 3 days.

EXAM:
CHEST  2 VIEW

[w chest pa]
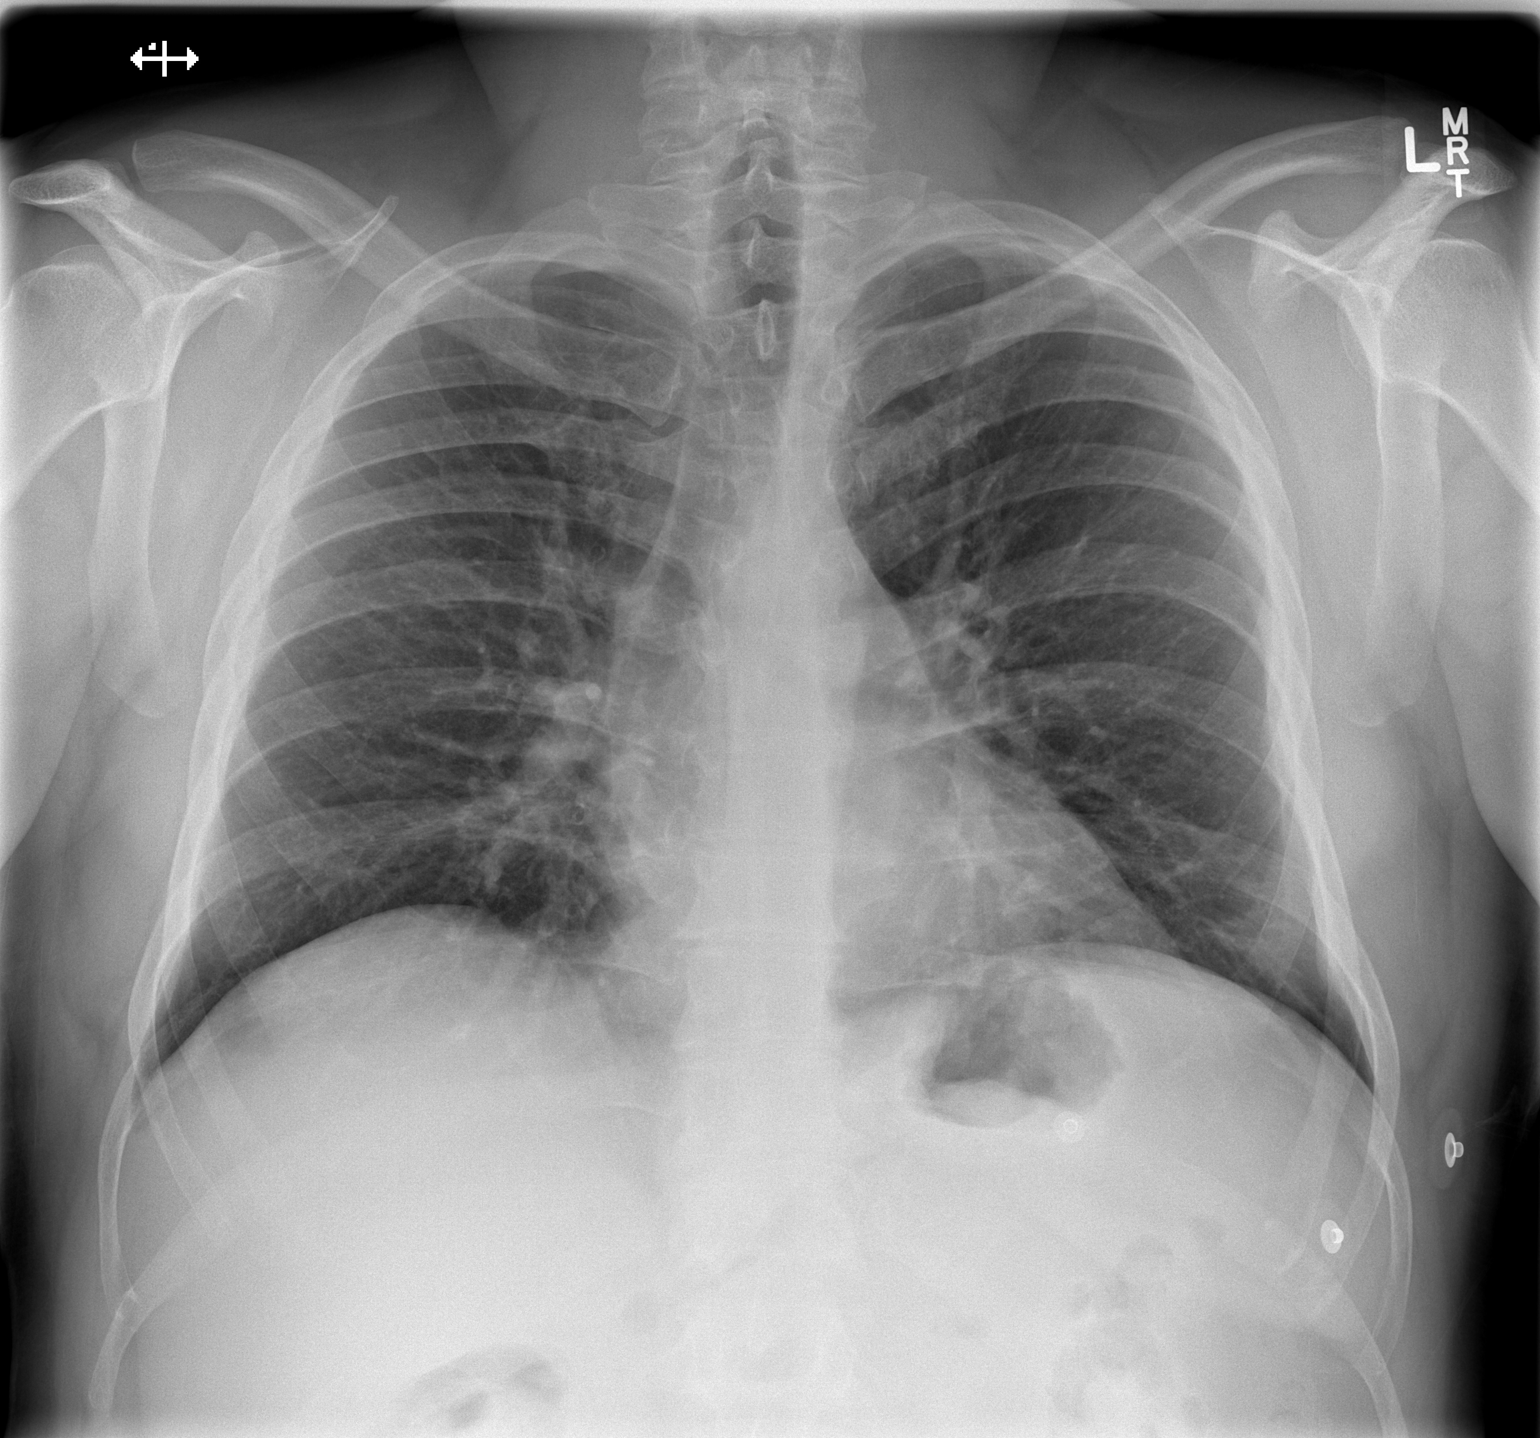

[w chest lat]
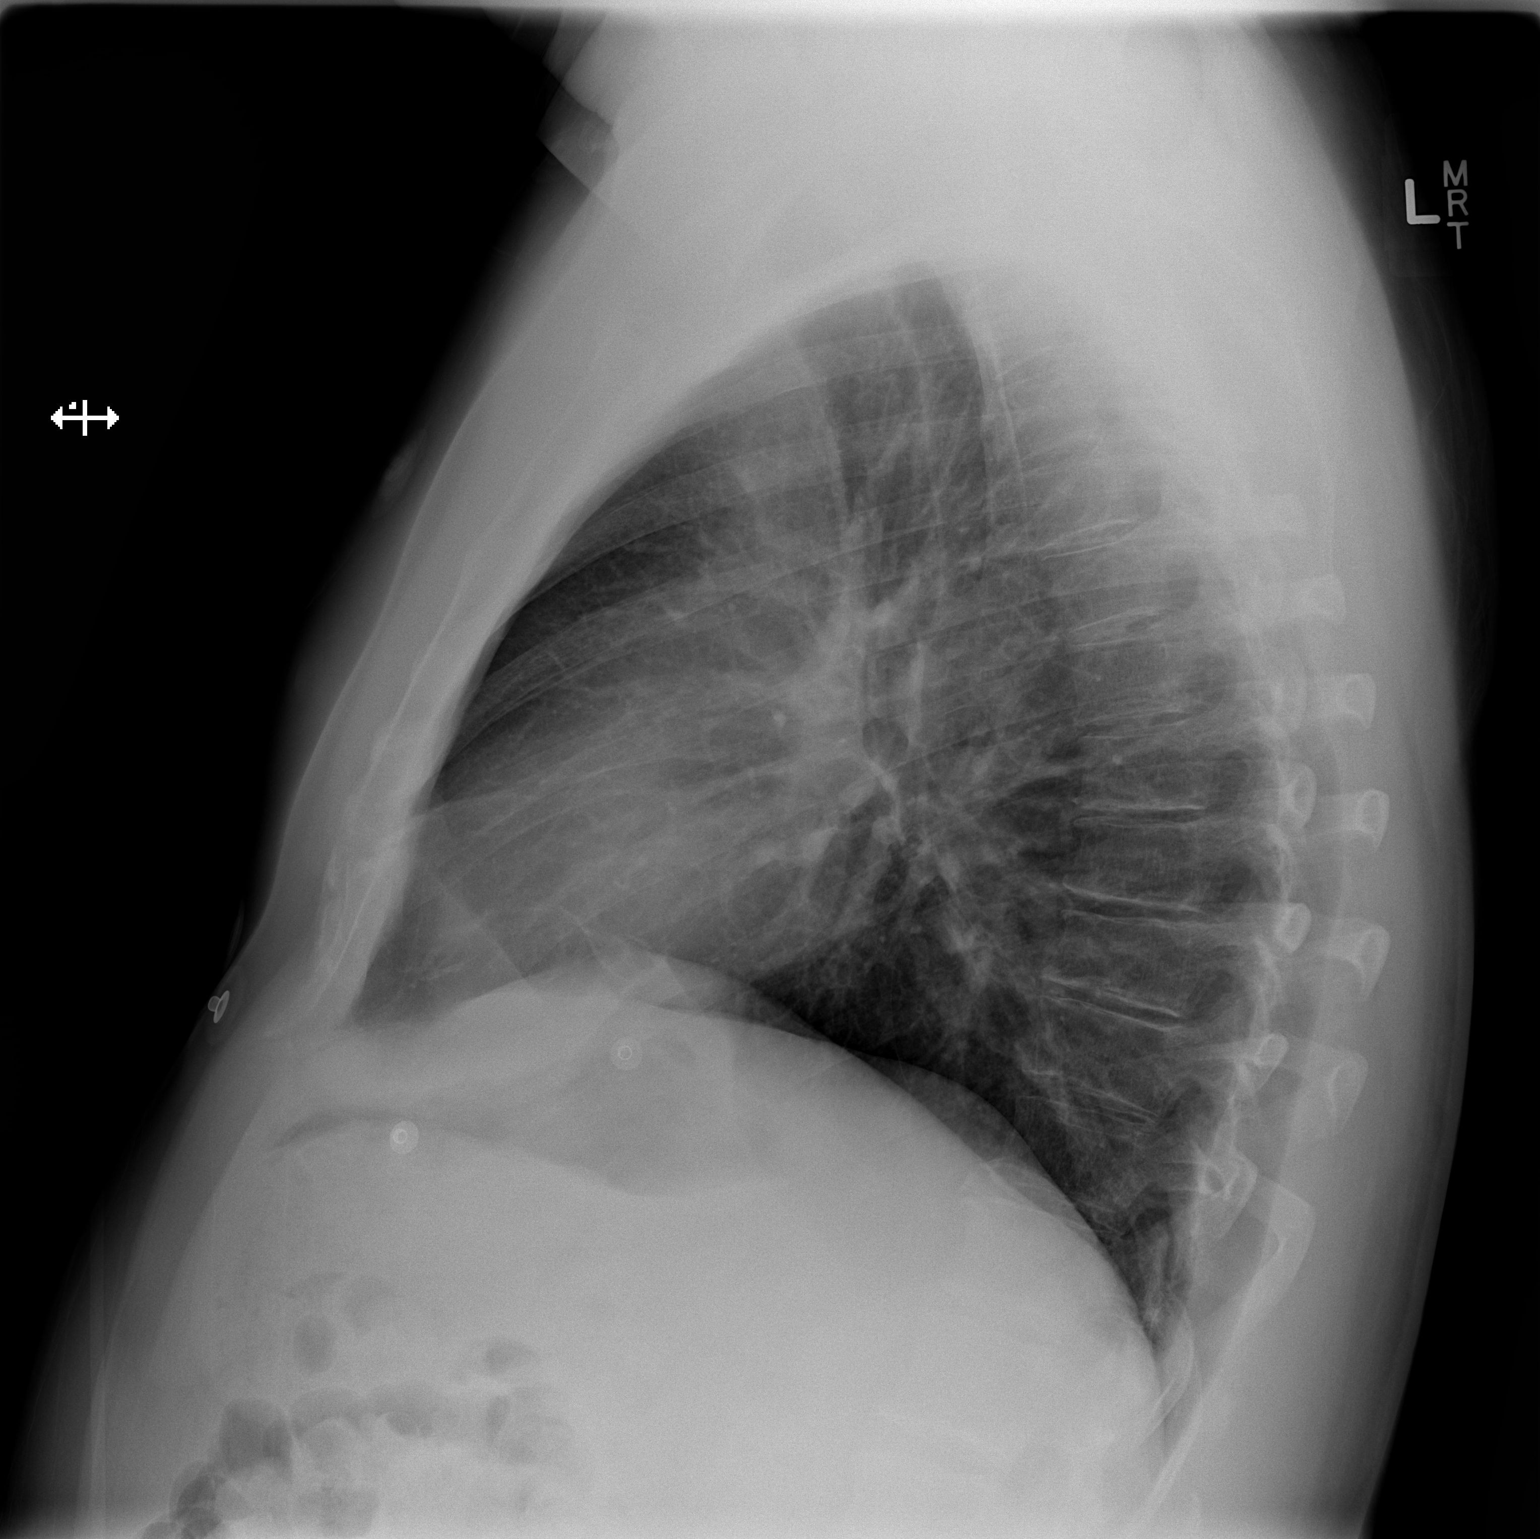

[2 of 2 positions shown; findings below may reference images not displayed]

FINDINGS: The cardiac silhouette, mediastinal and hilar contours are within
normal limits. Minimal streaky bibasilar atelectasis but no
infiltrates or effusions. The bony thorax is intact.
IMPRESSION: Minimal streaky basilar atelectasis but no infiltrates, edema or
effusions.

## 2016-07-28 IMAGING — CT CT ANGIO CHEST
2 of 9 series · 18 of 46 positions shown · IV contrast (APPLIED)
Comparison: Chest radiograph performed earlier today at [DATE] p.m.

CLINICAL DATA: Acute onset of tachycardia.  Initial encounter.

EXAM:
CT ANGIOGRAPHY CHEST WITH CONTRAST
TECHNIQUE: Multidetector CT imaging of the chest was performed using the
standard protocol during bolus administration of intravenous
contrast. Multiplanar CT image reconstructions and MIPs were
obtained to evaluate the vascular anatomy.
CONTRAST:  100mL OMNIPAQUE IOHEXOL 350 MG/ML SOLN

[Series 5: thins · axial · 0.74mm/px · z∈[+40,+234]mm · 15 of 220 slices shown]
[im 13/220  lung]
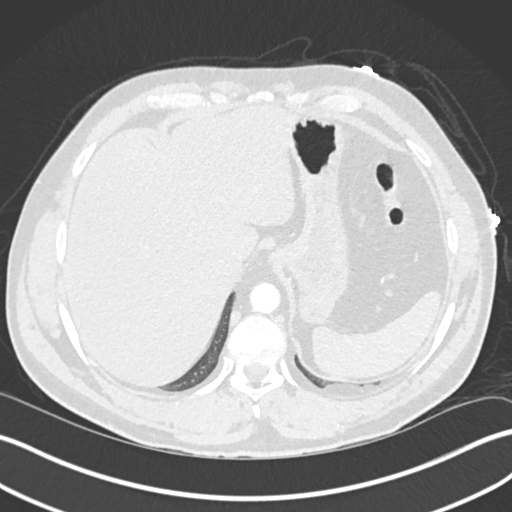
[im 26/220  soft-tissue]
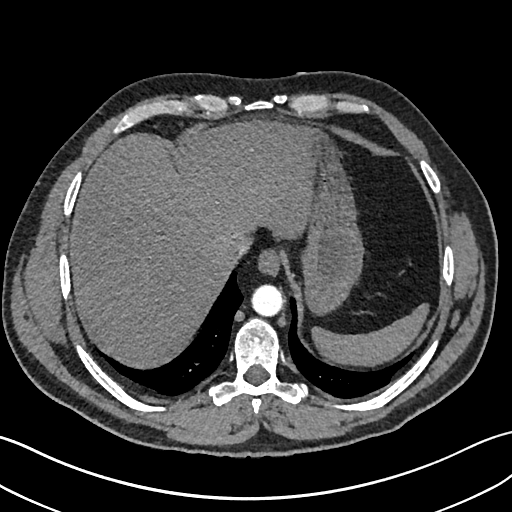
[im 39/220  lung]
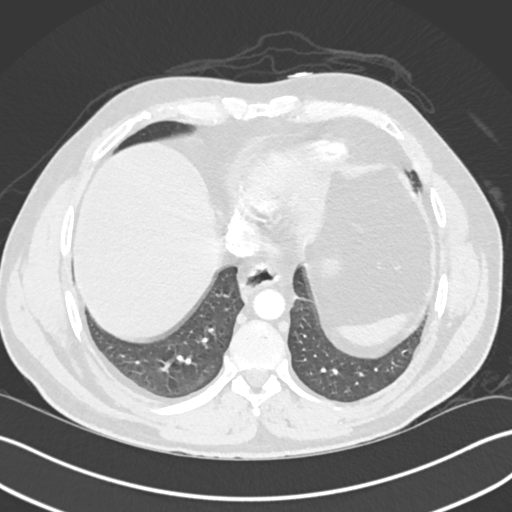
[im 52/220  soft-tissue]
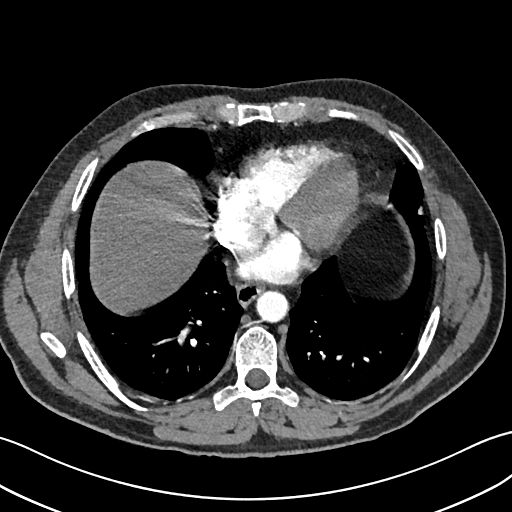
[im 65/220  lung]
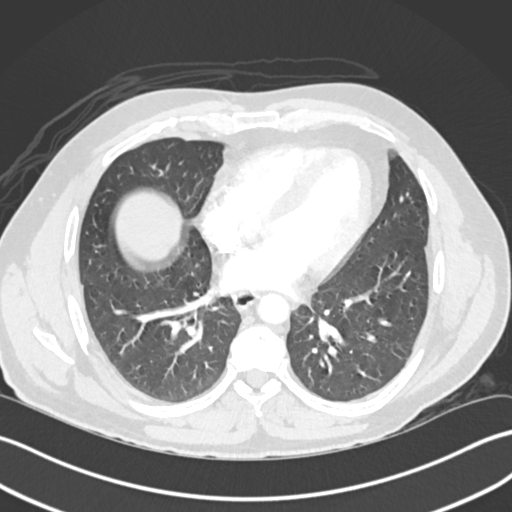
[im 78/220  soft-tissue]
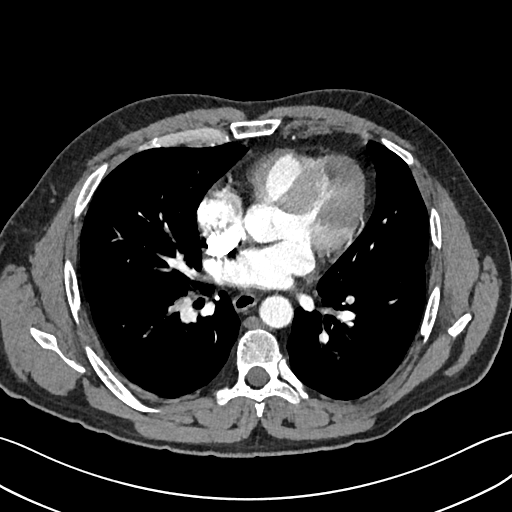
[im 91/220  lung]
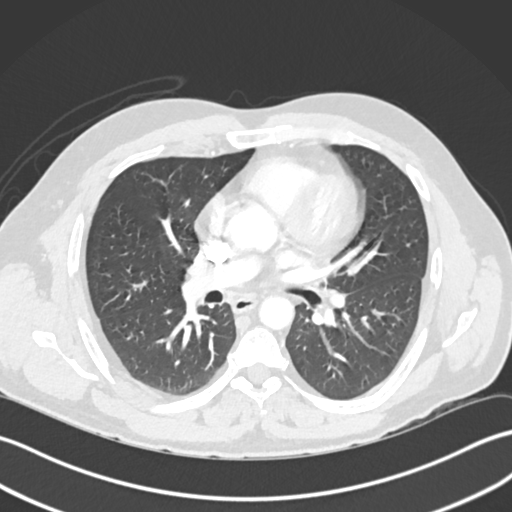
[im 116/220  soft-tissue]
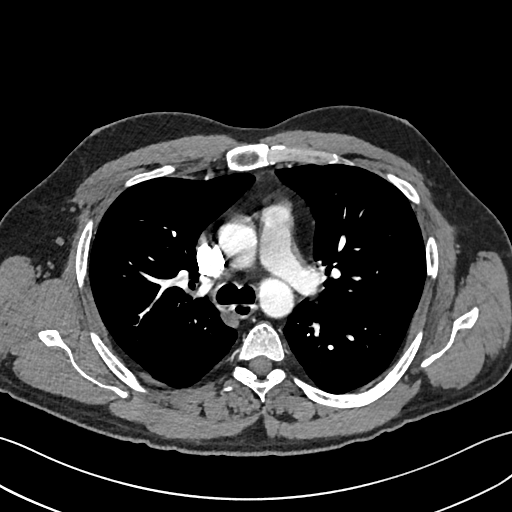
[im 129/220  lung]
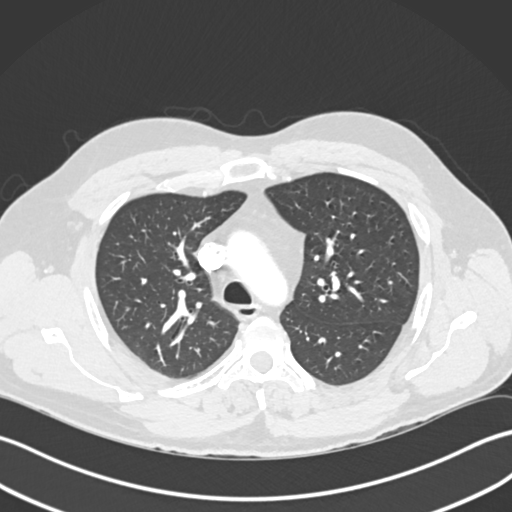
[im 142/220  soft-tissue]
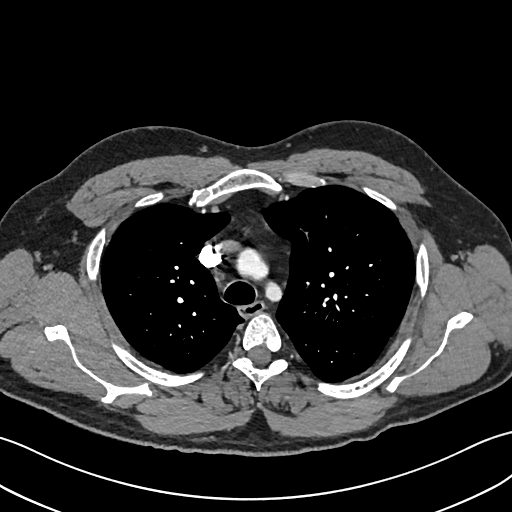
[im 155/220  lung]
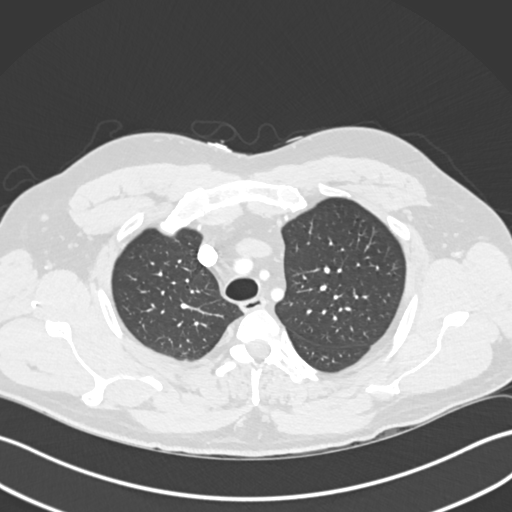
[im 168/220  soft-tissue]
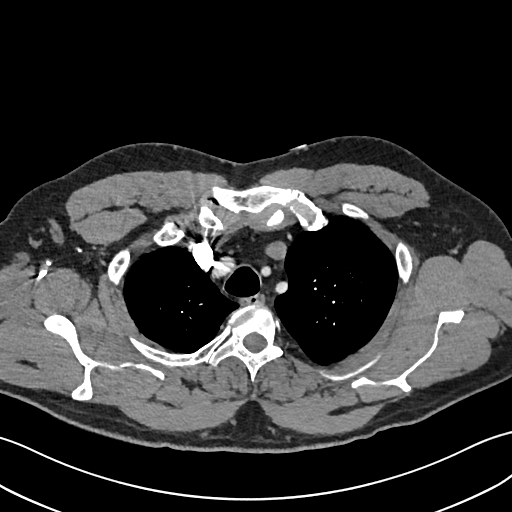
[im 181/220  lung]
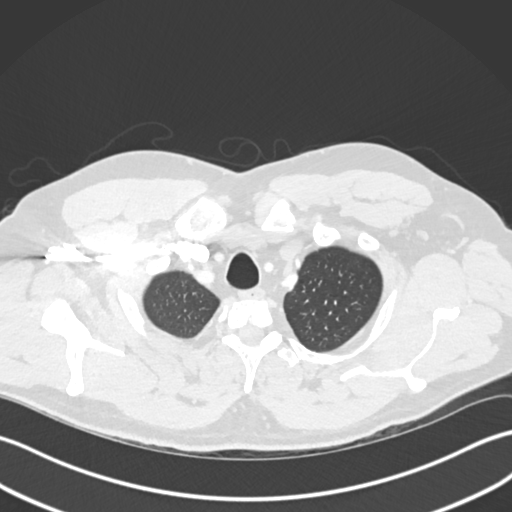
[im 194/220  soft-tissue]
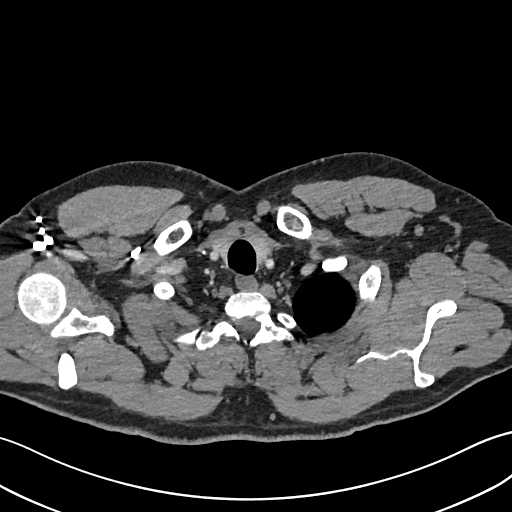
[im 207/220  lung]
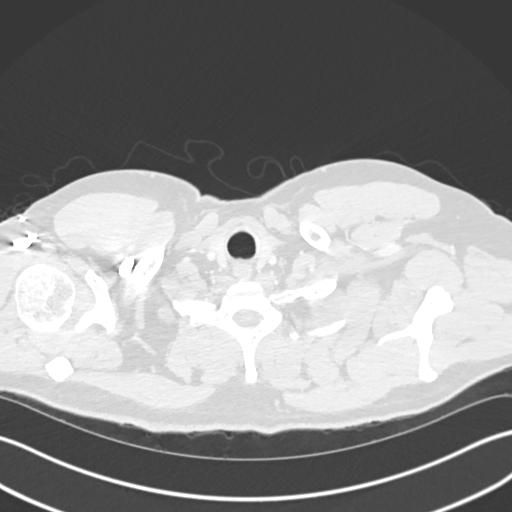

[Series 7: coronal mpr · coronal · 0.46mm/px · 3 of 151 slices shown]
[im 38/151  soft-tissue]
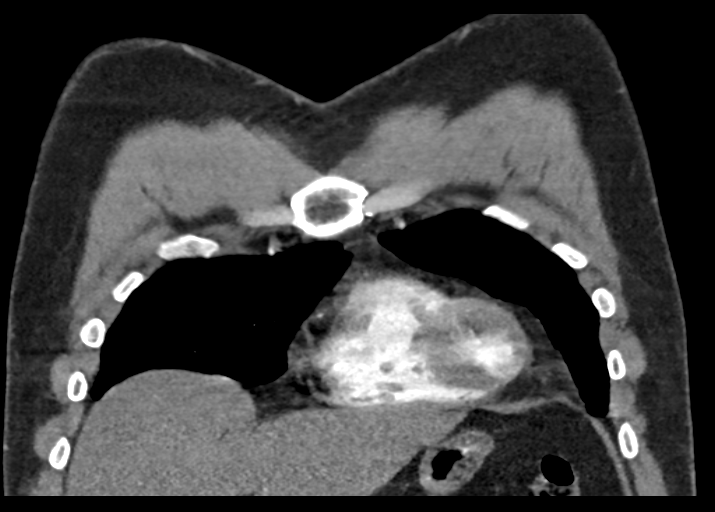
[im 76/151  soft-tissue]
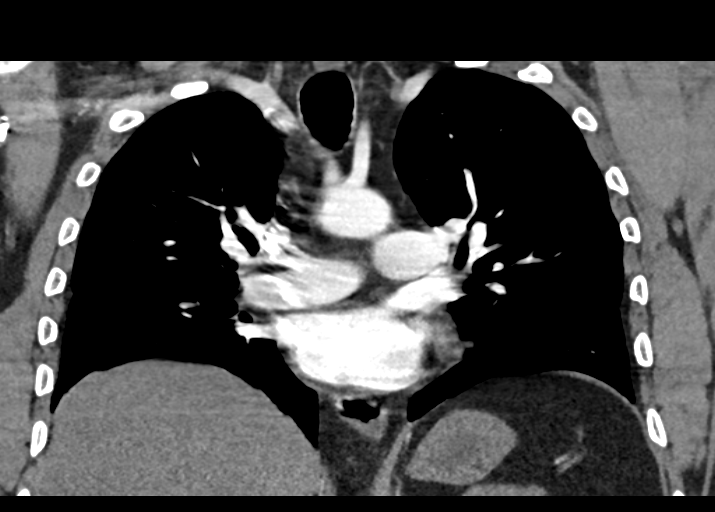
[im 113/151  soft-tissue]
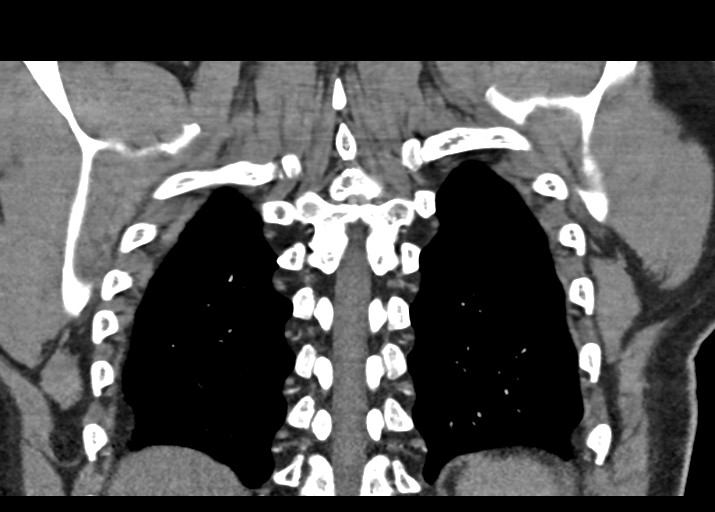

[18 of 46 positions shown; findings below may reference images not displayed]

FINDINGS: There is no evidence of significant pulmonary embolus. Evaluation
for pulmonary embolus is mildly suboptimal due to limitations in the
timing of the contrast bolus.

Minimal left basilar atelectasis or scarring is noted. The lungs are
otherwise clear. The lung bases are incompletely imaged on this
study. There is no evidence of significant focal consolidation,
pleural effusion or pneumothorax. No masses are identified; no
abnormal focal contrast enhancement is seen.

The mediastinum is unremarkable in appearance. No mediastinal
lymphadenopathy is seen. No pericardial effusion is identified. The
great vessels are grossly unremarkable in appearance. No axillary
lymphadenopathy is seen. The visualized portions of the thyroid
gland are unremarkable in appearance.

The visualized portions of the liver and spleen are unremarkable.

No acute osseous abnormalities are seen.

Review of the MIP images confirms the above findings.
IMPRESSION: 1. No evidence of significant pulmonary embolus.
2. Minimal left basilar atelectasis or scarring noted. Lungs
otherwise clear.

## 2016-09-15 ENCOUNTER — Ambulatory Visit (HOSPITAL_COMMUNITY)
Admission: EM | Admit: 2016-09-15 | Discharge: 2016-09-15 | Disposition: A | Discharge status: Home / Self Care | Payer: 59 | Attending: Family Medicine | Admitting: Family Medicine

## 2016-09-15 ENCOUNTER — Encounter (HOSPITAL_COMMUNITY): Payer: Self-pay | Admitting: Emergency Medicine

## 2016-09-15 ENCOUNTER — Ambulatory Visit (INDEPENDENT_AMBULATORY_CARE_PROVIDER_SITE_OTHER): Payer: 59

## 2016-09-15 DIAGNOSIS — R000 Tachycardia, unspecified: Secondary | ICD-10-CM

## 2016-09-15 DIAGNOSIS — R030 Elevated blood-pressure reading, without diagnosis of hypertension: Secondary | ICD-10-CM

## 2016-09-15 DIAGNOSIS — F1590 Other stimulant use, unspecified, uncomplicated: Secondary | ICD-10-CM | POA: Diagnosis not present

## 2016-09-15 DIAGNOSIS — J069 Acute upper respiratory infection, unspecified: Secondary | ICD-10-CM

## 2016-09-15 DIAGNOSIS — Z789 Other specified health status: Secondary | ICD-10-CM

## 2016-09-15 MED ORDER — AZITHROMYCIN 250 MG PO TABS: 250.0000 mg | ORAL_TABLET | Freq: Every day | ORAL | 0 refills | Status: DC

## 2016-09-15 MED ORDER — BENZONATATE 100 MG PO CAPS: 100.0000 mg | ORAL_CAPSULE | Freq: Three times a day (TID) | ORAL | 0 refills | Status: DC

## 2016-09-15 NOTE — ED Provider Notes (Signed)
CSN: [id number]     Arrival date & time 09/15/16  1912 History   First MD Initiated Contact with Patient 09/15/16 1923     Chief Complaint  Patient presents with  . URI   (Consider location/radiation/quality/duration/timing/severity/associated sxs/prior Treatment) HPI  Jerry Garcia is a 46 y.o. male presenting to UC with c/o URI symptoms since 09/02/16.  Pt notes symptoms initially started with a sore throat and nasal congestion, developed into a productive cough with yellow-green sputum.  Sputum has now turned into a brown color. Associated chest soreness from the cough.  He has been taking Dayquil and Nyquil. Denies n/v/d. No sick contacts. He missed getting the flu vaccine this year.   BP elevated in triage- 158/100 and HR- 122 O2 95% on RA.  Pt denies hx of HTN, however last time he was seen in UC on 05/08/16, BP was 157/87.  He has been taking OTC decongestants. Denies leg pain or swelling. Denies SOB. No hx of blood clots. Pt does have a hx of tachycardia.  Pt notes he does drink a lot of caffeine and drank a lot of tea just before coming here. He knows he is suppose to cut back.  Past Medical History:  Diagnosis Date  . Chronic right hip pain   . Ulcerative colitis    Past Surgical History:  Procedure Laterality Date  . COLONOSCOPY  04/30/2013  . HERNIA REPAIR  [id number]   umbilical  . NASAL SINUS SURGERY     Family History  Problem Relation Age of Onset  . Hypertension Mother   . Diabetes Mother   . Cancer Mother   . Alzheimer's disease Father   . Stomach cancer Paternal Uncle   . Colon cancer Neg Hx    Social History  Substance Use Topics  . Smoking status: Never Smoker  . Smokeless tobacco: Never Used  . Alcohol use No    Review of Systems  Constitutional: Negative for chills and fever.  HENT: Positive for congestion. Negative for ear pain, sore throat, trouble swallowing and voice change.   Respiratory: Positive for cough. Negative for shortness of breath.    Cardiovascular: Positive for chest pain (soreness from cough). Negative for palpitations.  Gastrointestinal: Negative for abdominal pain, diarrhea, nausea and vomiting.  Musculoskeletal: Negative for arthralgias, back pain and myalgias.  Skin: Negative for rash.    Allergies  Oxycodone-acetaminophen  Home Medications   Prior to Admission medications   Medication Sig Start Date End Date Taking? Authorizing Provider  acetaminophen (TYLENOL) 325 MG tablet Take 650 mg by mouth every 6 (six) hours as needed for moderate pain.    Historical Provider, MD  azithromycin (ZITHROMAX) 250 MG tablet Take 1 tablet (250 mg total) by mouth daily. Take first 2 tablets together, then 1 every day until finished. 09/15/16   Noland Fordyce, PA-C  benzonatate (TESSALON) 100 MG capsule Take 1 capsule (100 mg total) by mouth every 8 (eight) hours. 09/15/16   Noland Fordyce, PA-C  escitalopram (LEXAPRO) 10 MG tablet TAKE 1 TABLET (10 MG TOTAL) BY MOUTH DAILY. 06/11/16   Mikey Kirschner, MD  mesalamine (CANASA) 1000 MG suppository Place 1 suppository (1,000 mg total) rectally at bedtime. 05/20/16   Irene Shipper, MD  pantoprazole (PROTONIX) 40 MG tablet Take 1 tablet by mouth every morning. 10/09/15   Mikey Kirschner, MD  psyllium (METAMUCIL) 58.6 % powder Take 1 packet by mouth daily.     Historical Provider, MD   Meds Ordered and Administered  this Visit  Medications - No data to display  BP (!) 148/101 (BP Location: Right Arm)   Pulse (!) 133   Temp 98.4 F (36.9 C) (Oral)   Resp 18   Ht 5\' 10"  (1.778 m)   Wt 215 lb (97.5 kg)   SpO2 97%   BMI 30.85 kg/m  No data found.   Physical Exam  Constitutional: He appears well-developed and well-nourished. No distress.  HENT:  Head: Normocephalic and atraumatic.  Right Ear: Tympanic membrane normal.  Left Ear: Tympanic membrane normal.  Nose: Mucosal edema present. Right sinus exhibits no maxillary sinus tenderness and no frontal sinus tenderness. Left sinus  exhibits no maxillary sinus tenderness and no frontal sinus tenderness.  Mouth/Throat: Uvula is midline, oropharynx is clear and moist and mucous membranes are normal.  Eyes: Conjunctivae are normal. No scleral icterus.  Neck: Normal range of motion. Neck supple.  Cardiovascular: Normal rate, regular rhythm and normal heart sounds.   Pulmonary/Chest: Effort normal and breath sounds normal. No stridor. No respiratory distress. He has no wheezes. He has no rales.  Abdominal: Soft. He exhibits no distension. There is no tenderness.  Musculoskeletal: Normal range of motion.  Lymphadenopathy:    He has no cervical adenopathy.  Neurological: He is alert.  Skin: Skin is warm and dry. He is not diaphoretic.  Nursing note and vitals reviewed.   Urgent Care Course   Clinical Course     Procedures (including critical care time)  Labs Review Labs Reviewed - No data to display  Imaging Review Dg Chest 2 View  Result Date: 09/15/2016 CLINICAL DATA:  Per pt: sick since 09/02/16 with cough, no fever, has been coughing up greenish/yellow sputum, today it is dark brown. Non-smoker. No history of cardiac or respiratory disease. No HBP, No diabetes. EXAM: CHEST  2 VIEW COMPARISON:  Go 06/14/2014 FINDINGS: Cardiac silhouette is normal in size. Normal mediastinal and hilar contours. Clear lungs.  No pleural effusion or pneumothorax. Skeletal structures are intact. IMPRESSION: No active cardiopulmonary disease. Electronically Signed   By: Lajean Manes M.D.   On: 09/15/2016 19:39      MDM   1. Upper respiratory tract infection, unspecified type   2. Tachycardia   3. Elevated blood pressure reading   4. Caffeine use    Pt c/o cough since Christmas, gradually worsening. Elevated BP and HR also noted in triage. Hx of same. Pt notes he does drink a lot of caffeine including tea just PTA.  No hx of blood clots. No respiratory distress noted on exam.  CXR: no evidence of pneumonia. Due to duration of  cough, will cover for atypical bacteria. Rx: azithromycin and tessalon Encouraged to cut back on caffeine consumption. F/u with PCP next week for recheck of vitals. Discussed symptoms that warrant emergent care in the ED. Patient verbalized understanding and agreement with treatment plan.     Noland Fordyce, PA-C 09/15/16 2012

## 2016-09-15 NOTE — ED Triage Notes (Signed)
PT reports  URI symptoms since Christmas day. PT reports a cough ever since. PT reports ti has been productive of green sputum, but today sputum became brown.

## 2016-09-15 NOTE — Discharge Instructions (Signed)
°  Please take the antibiotic prescribed for your cough.  Your blood pressure and heart rate were elevated today.  It is important to cut back on your caffeine consumption to help decrease the stress on your heart and blood vessels.  Try to avoid taking over the counter decongestants at this time due to your elevated blood pressure and heart rate.  Please follow up with your family doctor later this week to recheck your blood pressure to make sure you do not need to  be started on a blood pressure medication.

## 2016-09-21 ENCOUNTER — Other Ambulatory Visit: Payer: Self-pay | Admitting: Family Medicine

## 2016-09-23 NOTE — Telephone Encounter (Signed)
Last AEX was 06/2016. Please advise

## 2016-09-23 NOTE — Telephone Encounter (Signed)
Ok for three mo total 

## 2016-12-09 ENCOUNTER — Ambulatory Visit: Payer: 59 | Admitting: Family Medicine

## 2016-12-19 ENCOUNTER — Other Ambulatory Visit: Payer: Self-pay | Admitting: *Deleted

## 2016-12-19 MED ORDER — ESCITALOPRAM OXALATE 10 MG PO TABS: ORAL_TABLET | ORAL | 0 refills | Status: DC

## 2016-12-20 ENCOUNTER — Other Ambulatory Visit: Payer: Self-pay | Admitting: *Deleted

## 2016-12-20 MED ORDER — ESCITALOPRAM OXALATE 10 MG PO TABS: ORAL_TABLET | ORAL | 0 refills | Status: DC

## 2017-01-08 ENCOUNTER — Ambulatory Visit: Payer: 59 | Admitting: Family Medicine

## 2017-02-20 ENCOUNTER — Ambulatory Visit: Payer: 59 | Admitting: Family Medicine

## 2017-03-10 ENCOUNTER — Other Ambulatory Visit: Payer: Self-pay | Admitting: Family Medicine

## 2017-03-11 NOTE — Telephone Encounter (Signed)
This +2 refill needs office visit

## 2017-04-28 ENCOUNTER — Other Ambulatory Visit: Payer: Self-pay | Admitting: Family Medicine

## 2017-04-28 NOTE — Telephone Encounter (Signed)
Ok times 15 tabs needs o v

## 2017-04-28 NOTE — Telephone Encounter (Signed)
Last seen 06/11/16

## 2017-08-20 ENCOUNTER — Other Ambulatory Visit: Payer: Self-pay | Admitting: Family Medicine

## 2017-08-29 ENCOUNTER — Telehealth: Payer: Self-pay | Admitting: Family Medicine

## 2017-08-29 MED ORDER — ESCITALOPRAM OXALATE 10 MG PO TABS: 10.0000 mg | ORAL_TABLET | Freq: Every day | ORAL | 0 refills | Status: DC

## 2017-08-29 NOTE — Telephone Encounter (Signed)
Prescription sent electronically to pharmacy. Patient notified and scheduled follow up office visit 

## 2017-08-29 NOTE — Telephone Encounter (Signed)
Requesting refill on escitalopram.  [facility] [address]

## 2017-08-29 NOTE — Telephone Encounter (Signed)
30 d worth, needs appt

## 2017-09-11 ENCOUNTER — Ambulatory Visit (INDEPENDENT_AMBULATORY_CARE_PROVIDER_SITE_OTHER): Payer: 59 | Admitting: Family Medicine

## 2017-09-11 ENCOUNTER — Encounter: Payer: Self-pay | Admitting: Family Medicine

## 2017-09-11 VITALS — BP 124/86 | Ht 70.0 in | Wt 225.0 lb

## 2017-09-11 DIAGNOSIS — F411 Generalized anxiety disorder: Secondary | ICD-10-CM

## 2017-09-11 DIAGNOSIS — R000 Tachycardia, unspecified: Secondary | ICD-10-CM

## 2017-09-11 MED ORDER — ESCITALOPRAM OXALATE 10 MG PO TABS: 10.0000 mg | ORAL_TABLET | Freq: Every day | ORAL | 3 refills | Status: DC

## 2017-09-11 NOTE — Progress Notes (Signed)
   Subjective:    Patient ID: Jerry Garcia, male    DOB: Feb 15, 1971, 47 y.o.   MRN: [id number]  HPIMed check up on anxiety and depression. Pt is taking lexapro 10mg . States he is doing well and has no concerns today.   Patient notes ongoing compliance with antidepressant medication. No obvious side effects. Reports does not miss a dose. Overall continues to help depression substantially. No thoughts of homicide or suicide. Would like to maintain medication.  Anxiety under control.  States medications definitely helps.  Feels minimal anxiety and depression at this time.  No suicidal or homicidal thoughts  Not xercising reg    Review of Systems No headache, no major weight loss or weight gain, no chest pain no back pain abdominal pain no change in bowel habits complete ROS otherwise negative     Objective:   Physical Exam Alert vitals stable, NAD. Blood pressure good on repeat. HEENT normal. Lungs clear. Heart regular rate and rhythm.        Assessment & Plan:  Impression generalized anxiety disorder with elements of depression.  Plan maintain Lexapro 10 mg daily.  Exercise discussed and encouraged.  Recommended wellness exam this year medications refilled

## 2017-12-10 ENCOUNTER — Ambulatory Visit: Payer: 59 | Admitting: Family Medicine

## 2018-01-09 ENCOUNTER — Ambulatory Visit: Payer: 59 | Admitting: Family Medicine

## 2018-02-19 ENCOUNTER — Encounter: Payer: Self-pay | Admitting: Family Medicine

## 2018-02-19 ENCOUNTER — Ambulatory Visit (INDEPENDENT_AMBULATORY_CARE_PROVIDER_SITE_OTHER): Payer: 59 | Admitting: Family Medicine

## 2018-02-19 VITALS — BP 134/88 | Ht 70.0 in | Wt 224.0 lb

## 2018-02-19 DIAGNOSIS — F411 Generalized anxiety disorder: Secondary | ICD-10-CM

## 2018-02-19 DIAGNOSIS — R000 Tachycardia, unspecified: Secondary | ICD-10-CM

## 2018-02-19 DIAGNOSIS — R7303 Prediabetes: Secondary | ICD-10-CM

## 2018-02-19 MED ORDER — ESCITALOPRAM OXALATE 10 MG PO TABS: 10.0000 mg | ORAL_TABLET | Freq: Every day | ORAL | 1 refills | Status: DC

## 2018-02-19 NOTE — Progress Notes (Signed)
   Subjective:    Patient ID: Jerry Garcia, male    DOB: [birth year], 47 y.o.   MRN: [id number]  HPI Patient is here today to follow up on his anxiety. He is currently taking Lexapro 10 mg daily. He states he is doing much better on it.  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  Needs blood work ordered    Patient has known history of prediabetes.  Also strong family history of diabetes.  Reports a fair compliance with his diet.  Concerned about the potential status for this.  Tachycardia.  Patient reports ongoing challenges with elevated heart rate.  Borderline in nature.  Has been observed by other clinicians.  Some concerns of patient.  Does seem to get worse with his anxiety.  Then it worsens as he focuses on it with his anxiety                Review of Systems No headache, no major weight loss or weight gain, no chest pain no back pain abdominal pain no change in bowel habits complete ROS otherwise negative     Objective:   Physical Exam    Alert and oriented, vitals reviewed and stable, NAD ENT-TM's and ext canals WNL bilat via otoscopic exam Soft palate, tonsils and post pharynx WNL via oropharyngeal exam Neck-symmetric, no masses; thyroid nonpalpable and nontender Pulmonary-no tachypnea or accessory muscle use; Clear without wheezes via auscultation Card--no abnrml murmurs, rhythm reg and rate WNL Carotid pulses symmetric, without bruits   2.     Assessment & Plan:  #1 impression.  Chronic generalized anxiety disorder.  Tolerating medications well.  Side effects benefits discussed meds maintained.  Tachycardia likely sinus tachycardia discussed 1/recommend no major cardiac work-up at this time rationale discussed.  3.  Prediabetes.  Known status with this.  Strong family history.   Concerned the patient.  The best diet in recent years.  Discussed with we will press on and evaluate.  Appropriate blood work.  Of note still has spells of rapid heart rate but not as many as before.  Known history of prediabetes.  Wellness plus chronic in 6 mo  Greater than 50% of this 25 minute face to face visit was spent in counseling and discussion and coordination of care regarding the above diagnosis/diagnosies

## 2018-02-20 LAB — HEPATIC FUNCTION PANEL
ALT: 34 IU/L (ref 0–44)
AST: 27 IU/L (ref 0–40)
Albumin: 4.5 g/dL (ref 3.5–5.5)
Alkaline Phosphatase: 88 IU/L (ref 39–117)
BILIRUBIN TOTAL: 0.5 mg/dL (ref 0.0–1.2)
Bilirubin, Direct: 0.13 mg/dL (ref 0.00–0.40)
Total Protein: 7.2 g/dL (ref 6.0–8.5)

## 2018-02-20 LAB — BASIC METABOLIC PANEL
BUN / CREAT RATIO: 11 (ref 9–20)
BUN: 12 mg/dL (ref 6–24)
CALCIUM: 9.5 mg/dL (ref 8.7–10.2)
CHLORIDE: 100 mmol/L (ref 96–106)
CO2: 22 mmol/L (ref 20–29)
Creatinine, Ser: 1.08 mg/dL (ref 0.76–1.27)
GFR calc non Af Amer: 80 mL/min/{1.73_m2} (ref >59)
GFR, EST AFRICAN AMERICAN: 93 mL/min/{1.73_m2} (ref >59)
GLUCOSE: 127 mg/dL — AB (ref 65–99)
Potassium: 4.8 mmol/L (ref 3.5–5.2)
Sodium: 137 mmol/L (ref 134–144)

## 2018-02-20 LAB — HEMOGLOBIN A1C
Est. average glucose Bld gHb Est-mCnc: 151 mg/dL
HEMOGLOBIN A1C: 6.9 % — AB (ref 4.8–5.6)

## 2018-02-20 LAB — LIPID PANEL
CHOL/HDL RATIO: 3.6 ratio (ref 0.0–5.0)
CHOLESTEROL TOTAL: 235 mg/dL — AB (ref 100–199)
HDL: 65 mg/dL (ref >39)
LDL CALC: 153 mg/dL — AB (ref 0–99)
TRIGLYCERIDES: 87 mg/dL (ref 0–149)
VLDL Cholesterol Cal: 17 mg/dL (ref 5–40)

## 2018-02-24 ENCOUNTER — Ambulatory Visit (INDEPENDENT_AMBULATORY_CARE_PROVIDER_SITE_OTHER): Payer: 59 | Admitting: Family Medicine

## 2018-02-24 ENCOUNTER — Encounter: Payer: Self-pay | Admitting: Family Medicine

## 2018-02-24 DIAGNOSIS — E119 Type 2 diabetes mellitus without complications: Secondary | ICD-10-CM

## 2018-02-24 MED ORDER — BLOOD GLUCOSE MONITOR KIT: PACK | 0 refills | Status: DC

## 2018-02-24 MED ORDER — METFORMIN HCL 500 MG PO TABS: 1000.0000 mg | ORAL_TABLET | Freq: Two times a day (BID) | ORAL | 5 refills | Status: DC

## 2018-02-24 NOTE — Patient Instructions (Addendum)
You have type 2 diabetes by definition Once you have it, you always have it  Now, going forward, experts recommend a yearly eye dr visit, and they need to be aware  With current diabetic recommendations, they are not as harsh regarding carbo's, for instance, bread potatoes pasta pizza, these are considered still ok but in moderation   The current rec regarding diabetic diets are somewhat complicated  Things that taste sweet or have added sugar we try to cut out of the diet  [facility] offers a new diabetic patient class free of charge, I pretty much insist that pts go to it, obviously[place].....  You need a glucometer, we'll write to ck the sugar each morning, but, don't ck the sugar each morning, ck about twice per wk, fasting, that will give you and me a sense of long term trends       Diabetes Mellitus and Nutrition When you have diabetes (diabetes mellitus), it is very important to have healthy eating habits because your blood sugar (glucose) levels are greatly affected by what you eat and drink. Eating healthy foods in the appropriate amounts, at about the same times every day, can help you:  Control your blood glucose.  Lower your risk of heart disease.  Improve your blood pressure.  Reach or maintain a healthy weight.  Every person with diabetes is different, and each person has different needs for a meal plan. Your health care provider may recommend that you work with a diet and nutrition specialist (dietitian) to make a meal plan that is best for you. Your meal plan may vary depending on factors such as:  The calories you need.  The medicines you take.  Your weight.  Your blood glucose, blood pressure, and cholesterol levels.  Your activity level.  Other health conditions you have, such as heart or kidney disease.  How do carbohydrates affect me? Carbohydrates affect your blood glucose level more than any other type of food. Eating carbohydrates  naturally increases the amount of glucose in your blood. Carbohydrate counting is a method for keeping track of how many carbohydrates you eat. Counting carbohydrates is important to keep your blood glucose at a healthy level, especially if you use insulin or take certain oral diabetes medicines. It is important to know how many carbohydrates you can safely have in each meal. This is different for every person. Your dietitian can help you calculate how many carbohydrates you should have at each meal and for snack. Foods that contain carbohydrates include:  Bread, cereal, rice, pasta, and crackers.  Potatoes and corn.  Peas, beans, and lentils.  Milk and yogurt.  Fruit and juice.  Desserts, such as cakes, cookies, ice cream, and candy.  How does alcohol affect me? Alcohol can cause a sudden decrease in blood glucose (hypoglycemia), especially if you use insulin or take certain oral diabetes medicines. Hypoglycemia can be a life-threatening condition. Symptoms of hypoglycemia (sleepiness, dizziness, and confusion) are similar to symptoms of having too much alcohol. If your health care provider says that alcohol is safe for you, follow these guidelines:  Limit alcohol intake to no more than 1 drink per day for nonpregnant women and 2 drinks per day for men. One drink equals 12 oz of beer, 5 oz of wine, or 1 oz of hard liquor.  Do not drink on an empty stomach.  Keep yourself hydrated with water, diet soda, or unsweetened iced tea.  Keep in mind that regular soda,  juice, and other mixers may contain a lot of sugar and must be counted as carbohydrates.  What are tips for following this plan? Reading food labels  Start by checking the serving size on the label. The amount of calories, carbohydrates, fats, and other nutrients listed on the label are based on one serving of the food. Many foods contain more than one serving per package.  Check the total grams (g) of carbohydrates in one  serving. You can calculate the number of servings of carbohydrates in one serving by dividing the total carbohydrates by 15. For example, if a food has 30 g of total carbohydrates, it would be equal to 2 servings of carbohydrates.  Check the number of grams (g) of saturated and trans fats in one serving. Choose foods that have low or no amount of these fats.  Check the number of milligrams (mg) of sodium in one serving. Most people should limit total sodium intake to less than 2,300 mg per day.  Always check the nutrition information of foods labeled as "low-fat" or "nonfat". These foods may be higher in added sugar or refined carbohydrates and should be avoided.  Talk to your dietitian to identify your daily goals for nutrients listed on the label. Shopping  Avoid buying canned, premade, or processed foods. These foods tend to be high in fat, sodium, and added sugar.  Shop around the outside edge of the grocery store. This includes fresh fruits and vegetables, bulk grains, fresh meats, and fresh dairy. Cooking  Use low-heat cooking methods, such as baking, instead of high-heat cooking methods like deep frying.  Cook using healthy oils, such as olive, canola, or sunflower oil.  Avoid cooking with butter, cream, or high-fat meats. Meal planning  Eat meals and snacks regularly, preferably at the same times every day. Avoid going long periods of time without eating.  Eat foods high in fiber, such as fresh fruits, vegetables, beans, and whole grains. Talk to your dietitian about how many servings of carbohydrates you can eat at each meal.  Eat 4-6 ounces of lean protein each day, such as lean meat, chicken, fish, eggs, or tofu. 1 ounce is equal to 1 ounce of meat, chicken, or fish, 1 egg, or 1/4 cup of tofu.  Eat some foods each day that contain healthy fats, such as avocado, nuts, seeds, and fish. Lifestyle   Check your blood glucose regularly.  Exercise at least 30 minutes 5 or more  days each week, or as told by your health care provider.  Take medicines as told by your health care provider.  Do not use any products that contain nicotine or tobacco, such as cigarettes and e-cigarettes. If you need help quitting, ask your health care provider.  Work with a Social worker or diabetes educator to identify strategies to manage stress and any emotional and social challenges. What are some questions to ask my health care provider?  Do I need to meet with a diabetes educator?  Do I need to meet with a dietitian?  What number can I call if I have questions?  When are the best times to check my blood glucose? Where to find more information:  American Diabetes Association: diabetes.org/food-and-fitness/food  Academy of Nutrition and Dietetics: PokerClues.dk  [employer] of Diabetes and Digestive and Kidney Diseases (NIH): ContactWire.be Summary  A healthy meal plan will help you control your blood glucose and maintain a healthy lifestyle.  Working with a diet and nutrition specialist (dietitian) can help you make a  meal plan that is best for you.  Keep in mind that carbohydrates and alcohol have immediate effects on your blood glucose levels. It is important to count carbohydrates and to use alcohol carefully. This information is not intended to replace advice given to you by your health care provider. Make sure you discuss any questions you have with your health care provider. Document Released: 05/23/2005 Document Revised: 09/30/2016 Document Reviewed: 09/30/2016 Elsevier Interactive Patient Education  [employer].

## 2018-02-24 NOTE — Progress Notes (Signed)
Subjective:    Patient ID: Jerry Garcia, male    DOB: [birth year], 47 y.o.   MRN: [id number]  Diabetes  He presents for his initial diabetic visit. He has type 2 diabetes mellitus. Risk factors for coronary artery disease include diabetes mellitus. He is compliant with treatment all of the time. His weight is stable.   Patient arrives to discuss new diagnosis of diabetes  Patient arrives office with new onset diabetes.  We just discovered this this week with elevated A1c.  Strong family history of type 2 diabetes.  Patient admits to inactivity and diminished attention to diet in recent years.  No polyuria polydipsia polyphagia  Results for orders placed or performed in visit on 02/19/18  Lipid Profile  Result Value Ref Range   Cholesterol, Total 235 (H) 100 - 199 mg/dL   Triglycerides 87 0 - 149 mg/dL   HDL 65 >39 mg/dL   VLDL Cholesterol Cal 17 5 - 40 mg/dL   LDL Calculated 153 (H) 0 - 99 mg/dL   Chol/HDL Ratio 3.6 0.0 - 5.0 ratio  Hepatic function panel  Result Value Ref Range   Total Protein 7.2 6.0 - 8.5 g/dL   Albumin 4.5 3.5 - 5.5 g/dL   Bilirubin Total 0.5 0.0 - 1.2 mg/dL   Bilirubin, Direct 0.13 0.00 - 0.40 mg/dL   Alkaline Phosphatase 88 39 - 117 IU/L   AST 27 0 - 40 IU/L   ALT 34 0 - 44 IU/L  Basic Metabolic Panel (BMET)  Result Value Ref Range   Glucose 127 (H) 65 - 99 mg/dL   BUN 12 6 - 24 mg/dL   Creatinine, Ser 1.08 0.76 - 1.27 mg/dL   GFR calc non Af Amer 80 >59 mL/min/1.73   GFR calc Af Amer 93 >59 mL/min/1.73   BUN/Creatinine Ratio 11 9 - 20   Sodium 137 134 - 144 mmol/L   Potassium 4.8 3.5 - 5.2 mmol/L   Chloride 100 96 - 106 mmol/L   CO2 22 20 - 29 mmol/L   Calcium 9.5 8.7 - 10.2 mg/dL  HgB A1c  Result Value Ref Range   Hgb A1c MFr Bld 6.9 (H) 4.8 - 5.6 %   Est. average glucose Bld gHb Est-mCnc 151 mg/dL    Review of Systems No headache, no major weight loss or weight gain, no chest pain no back pain abdominal pain no change in bowel  habits complete ROS otherwise negative     Objective:   Physical Exam Alert and oriented, vitals reviewed and stable, NAD ENT-TM's and ext canals WNL bilat via otoscopic exam Soft palate, tonsils and post pharynx WNL via oropharyngeal exam Neck-symmetric, no masses; thyroid nonpalpable and nontender Pulmonary-no tachypnea or accessory muscle use; Clear without wheezes via auscultation Card--no abnrml murmurs, rhythm reg and rate WNL Carotid pulses symmetric, without bruits        Assessment & Plan:  You have type 2 diabetes by definition Once you have it, you always have it  Now, going forward, experts recommend a yearly eye dr visit, and they need to be aware  With current diabetic recommendations, they are not as harsh regarding carbo's, for instance, bread potatoes pasta pizza, these are considered still ok but in moderation   The current rec regarding diabetic diets are somewhat complicated  Things that taste sweet or have added sugar we try to cut out of the diet  [facility] offers a new diabetic patient class free of charge, I pretty much  insist that pts go to it, obviously[place].....  You need a glucometer, we'll write to ck the sugar each morning, but, don't ck the sugar each morning, ck about twice per wk, fasting, that will give you and me a sense of long term trends   Greater than 50% of this 25 minute face to face visit was spent in counseling and discussion and coordination of care regarding the above diagnosis/diagnosies   New onset type 2 diabetes.  Metformin initiated.  Side effects benefits discussed at great length.  Follow-up as scheduled.

## 2018-05-27 ENCOUNTER — Ambulatory Visit (INDEPENDENT_AMBULATORY_CARE_PROVIDER_SITE_OTHER): Payer: 59 | Admitting: Family Medicine

## 2018-05-27 ENCOUNTER — Encounter: Payer: Self-pay | Admitting: Family Medicine

## 2018-05-27 VITALS — BP 134/66 | Ht 70.0 in | Wt 216.0 lb

## 2018-05-27 DIAGNOSIS — F5101 Primary insomnia: Secondary | ICD-10-CM

## 2018-05-27 DIAGNOSIS — E119 Type 2 diabetes mellitus without complications: Secondary | ICD-10-CM | POA: Diagnosis not present

## 2018-05-27 DIAGNOSIS — F411 Generalized anxiety disorder: Secondary | ICD-10-CM

## 2018-05-27 LAB — POCT GLYCOSYLATED HEMOGLOBIN (HGB A1C): HEMOGLOBIN A1C: 5.5 % (ref 4.0–5.6)

## 2018-05-27 MED ORDER — BLOOD GLUCOSE MONITOR KIT: PACK | 0 refills | Status: DC

## 2018-05-27 MED ORDER — METFORMIN HCL 500 MG PO TABS: ORAL_TABLET | ORAL | 1 refills | Status: DC

## 2018-05-27 MED ORDER — ESCITALOPRAM OXALATE 10 MG PO TABS: 10.0000 mg | ORAL_TABLET | Freq: Every day | ORAL | 1 refills | Status: DC

## 2018-05-27 NOTE — Progress Notes (Signed)
   Subjective:    Patient ID: Jerry Garcia, male    DOB: 12-Sep-1970, 47 y.o.   MRN: [id number]  Diabetes  He presents for his follow-up diabetic visit. He has type 2 diabetes mellitus. There are no hypoglycemic associated symptoms. There are no diabetic associated symptoms. There are no hypoglycemic complications. There are no diabetic complications. He does not see a podiatrist.Eye exam is not current.   Results for orders placed or performed in visit on 05/27/18  POCT HgB A1C  Result Value Ref Range   Hemoglobin A1C 5.5 4.0 - 5.6 %   HbA1c POC (<> result, manual entry)     HbA1c, POC (prediabetic range)     HbA1c, POC (controlled diabetic range)      Numbers 107 or 108  Patient claims compliance with diabetes medication. No obvious side effects. Reports no substantial low sugar spells. Most numbers are generally in good range when checked fasting. Generally does not miss a dose of medication. Watching diabetic diet closely  No low sugar spells   Exercise walking in the eve, doing better with this, has lost some weight    [place] ol of 180.ying to lose weight   Left heel quite painful, feels almost like a spur, Pos substantial tenderness, recalls no injury, quite painful first thing in the morning  lexapro working well     Review of Systems No headache, no major weight loss or weight gain, no chest pain no back pain abdominal pain no change in bowel habits complete ROS otherwise negative     Objective:   Physical Exam  Alert and oriented, vitals reviewed and stable, NAD ENT-TM's and ext canals WNL bilat via otoscopic exam Soft palate, tonsils and post pharynx WNL via oropharyngeal exam Neck-symmetric, no masses; thyroid nonpalpable and nontender Pulmonary-no tachypnea or accessory muscle use; Clear without wheezes via auscultation Card--no abnrml murmurs, rhythm reg and rate WNL Carotid pulses symmetric, without bruits Medial and lateral left heel quite tender to  palpation.      Assessment & Plan:  Impression 1 type 2 diabetes discussed tight control to maintain same.  2.  Plantar fasciitis.  Discussed heel pads encouraged.  Stretching exercise discussed foot protection discussed anti-inflammatory medicine as needed.  3.  Generalized anxiety disorder.  Substantial.  Good control on current meds discussed her graph diet exercise discussed medications refilled vaccines discussed follow-up in 6 months

## 2018-05-27 NOTE — Patient Instructions (Signed)

## 2018-06-19 ENCOUNTER — Other Ambulatory Visit: Payer: Self-pay | Admitting: Family Medicine

## 2018-08-21 ENCOUNTER — Ambulatory Visit: Payer: 59 | Admitting: Family Medicine

## 2018-08-30 ENCOUNTER — Encounter (HOSPITAL_COMMUNITY): Payer: Self-pay

## 2018-08-30 ENCOUNTER — Emergency Department (HOSPITAL_COMMUNITY)
Admission: EM | Admit: 2018-08-30 | Discharge: 2018-08-30 | Disposition: A | Discharge status: Home / Self Care | Payer: 59 | Attending: Emergency Medicine | Admitting: Emergency Medicine

## 2018-08-30 ENCOUNTER — Other Ambulatory Visit: Payer: Self-pay

## 2018-08-30 DIAGNOSIS — R42 Dizziness and giddiness: Secondary | ICD-10-CM | POA: Insufficient documentation

## 2018-08-30 DIAGNOSIS — E119 Type 2 diabetes mellitus without complications: Secondary | ICD-10-CM | POA: Diagnosis not present

## 2018-08-30 DIAGNOSIS — Z7984 Long term (current) use of oral hypoglycemic drugs: Secondary | ICD-10-CM | POA: Diagnosis not present

## 2018-08-30 DIAGNOSIS — Z79899 Other long term (current) drug therapy: Secondary | ICD-10-CM | POA: Insufficient documentation

## 2018-08-30 HISTORY — DX: Type 2 diabetes mellitus without complications: E11.9

## 2018-08-30 LAB — BASIC METABOLIC PANEL
Anion gap: 11 (ref 5–15)
BUN: 14 mg/dL (ref 6–20)
CHLORIDE: 101 mmol/L (ref 98–111)
CO2: 25 mmol/L (ref 22–32)
Calcium: 9.3 mg/dL (ref 8.9–10.3)
Creatinine, Ser: 1.11 mg/dL (ref 0.61–1.24)
GFR calc Af Amer: >60 mL/min (ref >60)
GFR calc non Af Amer: >60 mL/min (ref >60)
Glucose, Bld: 143 mg/dL — ABNORMAL HIGH (ref 70–99)
POTASSIUM: 5.1 mmol/L (ref 3.5–5.1)
Sodium: 137 mmol/L (ref 135–145)

## 2018-08-30 LAB — CBC WITH DIFFERENTIAL/PLATELET
Abs Immature Granulocytes: 0.04 10*3/uL (ref 0.00–0.07)
Basophils Absolute: 0.1 10*3/uL (ref 0.0–0.1)
Basophils Relative: 1 %
Eosinophils Absolute: 0.2 10*3/uL (ref 0.0–0.5)
Eosinophils Relative: 3 %
HCT: 45.1 % (ref 39.0–52.0)
Hemoglobin: 14.4 g/dL (ref 13.0–17.0)
IMMATURE GRANULOCYTES: 1 %
LYMPHS ABS: 1.3 10*3/uL (ref 0.7–4.0)
LYMPHS PCT: 18 %
MCH: 28.6 pg (ref 26.0–34.0)
MCHC: 31.9 g/dL (ref 30.0–36.0)
MCV: 89.5 fL (ref 80.0–100.0)
Monocytes Absolute: 0.6 10*3/uL (ref 0.1–1.0)
Monocytes Relative: 8 %
Neutro Abs: 5.2 10*3/uL (ref 1.7–7.7)
Neutrophils Relative %: 69 %
Platelets: 377 10*3/uL (ref 150–400)
RBC: 5.04 MIL/uL (ref 4.22–5.81)
RDW: 13 % (ref 11.5–15.5)
WBC: 7.4 10*3/uL (ref 4.0–10.5)
nRBC: 0 % (ref 0.0–0.2)

## 2018-08-30 LAB — I-STAT TROPONIN, ED: Troponin i, poc: 0 ng/mL (ref 0.00–0.08)

## 2018-08-30 NOTE — ED Notes (Signed)
PT verbalized understanding of dc instructions, vss, ambulatory upon dc with steady gait

## 2018-08-30 NOTE — ED Triage Notes (Signed)
Pt from home w/ a c/o dizziness and a slight headache on the right side of his head. Pt reports that he woke up dizzy. No N/V/D or SOB. No falls or LOC. Additional complaints of a discomfort in the middle of his sternum. The CP is intermittent and non-radiating. The pain is not reproducible upon palpation. No coughs present. No recent hx of injury or illness.

## 2018-08-30 NOTE — ED Provider Notes (Signed)
[facility] EMERGENCY DEPARTMENT Provider Note   CSN: [id number] Arrival date & time: 08/30/18  0350     History   Chief Complaint Chief Complaint  Patient presents with  . Dizziness    HPI Yarden L Thain is a 47 y.o. male.  The history is provided by the patient.  Dizziness  Quality:  Lightheadedness Severity:  Mild Onset quality:  Sudden Timing:  Intermittent Progression:  Waxing and waning Chronicity:  New Context: standing up   Context: not when bending over, not with ear pain, not with head movement, not with inactivity, not with loss of consciousness and not with medication   Relieved by:  Nothing Worsened by:  Nothing Associated symptoms: no blood in stool, no chest pain, no diarrhea, no headaches, no hearing loss, no nausea, no palpitations, no shortness of breath, no syncope, no tinnitus, no vision changes, no vomiting and no weakness   Risk factors: no hx of stroke, no hx of vertigo and no multiple medications   Risk factors comment:  Anxiety   Past Medical History:  Diagnosis Date  . Chronic right hip pain   . Diabetes mellitus without complication ([place])   . Ulcerative colitis     Patient Active Problem List   Diagnosis Date Noted  . Type 2 diabetes mellitus without complication, without long-term current use of insulin ([place]) 02/24/2018  . Insomnia 09/24/2014  . Generalized anxiety disorder 06/26/2014  . Fasciculations of muscle 06/14/2014  . GERD (gastroesophageal reflux disease) 06/14/2014  . Tachycardia 06/14/2014  . Impaired fasting glucose 02/01/2014  . Esophageal reflux 02/01/2014  . Right hip pain 05/05/2013  . HEMORRHOIDS-INTERNAL 10/25/2010  . HEMORRHOIDS-EXTERNAL 10/25/2010  . Ulcerative colitis ([place]) 10/25/2010  . RECTAL BLEEDING 10/25/2010  . PROCTITIS 10/25/2010    Past Surgical History:  Procedure Laterality Date  . COLONOSCOPY  04/30/2013  . HERNIA REPAIR  0938   umbilical  . NASAL SINUS SURGERY           Home Medications    Prior to Admission medications   Medication Sig Start Date End Date Taking? Authorizing Provider  ACCU-CHEK AVIVA PLUS test strip TEST ONCE DAILY 06/19/18  Yes Mikey Kirschner, MD  acetaminophen (TYLENOL) 325 MG tablet Take 650 mg by mouth every 6 (six) hours as needed for moderate pain.   Yes [provider]  blood glucose meter kit and supplies KIT Dispense based on patient and insurance preference. Tests once daily as directed. (FOR ICD-10- E11.9) 05/27/18  Yes Mikey Kirschner, MD  escitalopram (LEXAPRO) 10 MG tablet Take 1 tablet (10 mg total) by mouth daily. 05/27/18  Yes Mikey Kirschner, MD  mesalamine (CANASA) 1000 MG suppository Place 1 suppository (1,000 mg total) rectally at bedtime. Patient taking differently: Place 1,000 mg rectally daily as needed (flare up).  05/20/16  Yes Irene Shipper, MD  metFORMIN (GLUCOPHAGE) 500 MG tablet Take one tablet by mouth twice daily 05/27/18  Yes Mikey Kirschner, MD    Family History Family History  Problem Relation Age of Onset  . Hypertension Mother   . Diabetes Mother   . Cancer Mother   . Alzheimer's disease Father   . Stomach cancer Paternal Uncle   . Colon cancer Neg Hx     Social History Social History   Tobacco Use  . Smoking status: Never Smoker  . Smokeless tobacco: Never Used  Substance Use Topics  . Alcohol use: No    Comment: occassionally   . Drug  use: No     Allergies   Oxycodone-acetaminophen   Review of Systems Review of Systems  Constitutional: Negative for chills and fever.  HENT: Negative for ear pain, hearing loss, sore throat and tinnitus.   Eyes: Negative for pain and visual disturbance.  Respiratory: Negative for cough and shortness of breath.   Cardiovascular: Negative for chest pain, palpitations and syncope.  Gastrointestinal: Negative for abdominal pain, blood in stool, diarrhea, nausea and vomiting.  Genitourinary: Negative for dysuria and  hematuria.  Musculoskeletal: Negative for arthralgias and back pain.  Skin: Negative for color change and rash.  Neurological: Positive for dizziness. Negative for tremors, seizures, syncope, facial asymmetry, speech difficulty, weakness, light-headedness, numbness and headaches.  All other systems reviewed and are negative.    Physical Exam Updated Vital Signs  ED Triage Vitals  Enc Vitals Group     BP 08/30/18 0645 (!) 144/101     Pulse Rate 08/30/18 0645 99     Resp 08/30/18 0645 14     Temp 08/30/18 0646 97.9 F (36.6 C)     Temp Source 08/30/18 0646 Oral     SpO2 08/30/18 0645 99 %     Weight 08/30/18 0648 215 lb (97.5 kg)     Height 08/30/18 0648 5' 10"  (1.778 m)     Head Circumference --      Peak Flow --      Pain Score 08/30/18 0647 1     Pain Loc --      Pain Edu? --      Excl. in [place]? --     Physical Exam Vitals signs and nursing note reviewed.  Constitutional:      Appearance: He is well-developed.  HENT:     Head: Normocephalic and atraumatic.     Nose: No congestion.     Mouth/Throat:     Mouth: Mucous membranes are moist.  Eyes:     Extraocular Movements: Extraocular movements intact.     Conjunctiva/sclera: Conjunctivae normal.     Pupils: Pupils are equal, round, and reactive to light.  Neck:     Musculoskeletal: Neck supple.  Cardiovascular:     Rate and Rhythm: Normal rate and regular rhythm.     Pulses: Normal pulses.     Heart sounds: Normal heart sounds. No murmur.  Pulmonary:     Effort: Pulmonary effort is normal. No respiratory distress.     Breath sounds: Normal breath sounds.  Abdominal:     Palpations: Abdomen is soft.     Tenderness: There is no abdominal tenderness.  Musculoskeletal:     Right lower leg: No edema.     Left lower leg: No edema.  Skin:    General: Skin is warm and dry.     Capillary Refill: Capillary refill takes less than 2 seconds.  Neurological:     General: No focal deficit present.     Mental Status: He is  alert and oriented to person, place, and time.     Cranial Nerves: No cranial nerve deficit.     Sensory: No sensory deficit.     Motor: No weakness.     Coordination: Coordination normal.     Gait: Gait normal.     Comments: 5+/5 strength, normal sensation, no drift, normal finger to nose finger      ED Treatments / Results  Labs (all labs ordered are listed, but only abnormal results are displayed) Labs Reviewed  BASIC METABOLIC PANEL - Abnormal; Notable for the  following components:      Result Value   Glucose, Bld 143 (*)    All other components within normal limits  CBC WITH DIFFERENTIAL/PLATELET  I-STAT TROPONIN, ED    EKG EKG Interpretation  Date/Time:  Sunday August 30 2018 07:12:43 EST Ventricular Rate:  98 PR Interval:    QRS Duration: 108 QT Interval:  352 QTC Calculation: 450 R Axis:   68 Text Interpretation:  Sinus rhythm Confirmed by Lennice Sites [id number]) on 08/30/2018 7:29:20 AM   Radiology No results found.  Procedures Procedures (including critical care time)  Medications Ordered in ED Medications - No data to display   Initial Impression / Assessment and Plan / ED Course  I have reviewed the triage vital signs and the nursing notes.  Pertinent labs & imaging results that were available during my care of the patient were reviewed by me and considered in my medical decision making (see chart for details).     Neftaly L Moyers is a 47 year old male with history of reflux, anxiety who presents to the ED with lightheadedness.  Patient with normal vitals.  No fever.  Symptoms began when he first woke up this morning.  They have now resolved.  Patient states when he first got up this morning he felt lightheaded.  No weakness, no numbness.  Had generalized weakness.  No nausea, no vomiting.  No abdominal pain, chest pain, shortness of breath.  Patient with history of anxiety.  Appears a little bit anxious on exam.  He is neurologically intact.   Patient with no symptoms upon my evaluation.  No abdominal tenderness.  Clear breath sounds.  EKG shows sinus rhythm no signs of ischemic changes.  Troponin within normal limits.  Doubt cardiac process.  Patient with no significant anemia, electrolyte abnormality, kidney injury, leukocytosis.  Patient with no signs concerning for stroke.  Etiology does not appear to be inner ear or vertigo related.  Possibly some orthostasis as symptoms happened when he first woke up.  Patient concerned about his blood pressure which is mildly elevated.  Recommend follow-up with primary care doctor after documenting blood pressures at home.  Overall given reassurance and discharged from ED in good condition.  This chart was dictated using voice recognition software.  Despite best efforts to proofread,  errors can occur which can change the documentation meaning.   Final Clinical Impressions(s) / ED Diagnoses   Final diagnoses:  [place]    ED Discharge Orders    None       Lennice Sites, DO 08/30/18 [phone]

## 2018-11-25 ENCOUNTER — Ambulatory Visit: Payer: Self-pay | Admitting: Family Medicine

## 2019-01-21 ENCOUNTER — Ambulatory Visit: Payer: Self-pay | Admitting: Family Medicine

## 2019-05-02 ENCOUNTER — Other Ambulatory Visit: Payer: Self-pay | Admitting: Family Medicine

## 2019-05-03 ENCOUNTER — Other Ambulatory Visit: Payer: Self-pay | Admitting: Family Medicine

## 2019-05-03 NOTE — Telephone Encounter (Signed)
Please contact patient and have patient set up appt; then route to nurses. Thank you

## 2019-05-04 NOTE — Telephone Encounter (Signed)
Contacted pt and he states he does not need a refill at this time. Advised pt to contact us 2 weeks before needing a refill so we can get him set up with Dr. Richardson Landry

## 2019-08-20 ENCOUNTER — Other Ambulatory Visit: Payer: Self-pay

## 2019-08-20 ENCOUNTER — Ambulatory Visit (INDEPENDENT_AMBULATORY_CARE_PROVIDER_SITE_OTHER): Payer: BC Managed Care – PPO | Admitting: Family Medicine

## 2019-08-20 DIAGNOSIS — Z79899 Other long term (current) drug therapy: Secondary | ICD-10-CM

## 2019-08-20 DIAGNOSIS — Z125 Encounter for screening for malignant neoplasm of prostate: Secondary | ICD-10-CM | POA: Diagnosis not present

## 2019-08-20 DIAGNOSIS — R7303 Prediabetes: Secondary | ICD-10-CM | POA: Diagnosis not present

## 2019-08-20 DIAGNOSIS — E119 Type 2 diabetes mellitus without complications: Secondary | ICD-10-CM | POA: Diagnosis not present

## 2019-08-20 DIAGNOSIS — Z23 Encounter for immunization: Secondary | ICD-10-CM | POA: Diagnosis not present

## 2019-08-20 DIAGNOSIS — F411 Generalized anxiety disorder: Secondary | ICD-10-CM

## 2019-08-20 MED ORDER — METFORMIN HCL 500 MG PO TABS: ORAL_TABLET | ORAL | 1 refills | Status: DC

## 2019-08-20 MED ORDER — ESCITALOPRAM OXALATE 10 MG PO TABS: 10.0000 mg | ORAL_TABLET | Freq: Every day | ORAL | 1 refills | Status: DC

## 2019-08-20 NOTE — Progress Notes (Signed)
   Subjective:  Audio only  Patient ID: Jerry Garcia, male    DOB: 1971-03-08, 48 y.o.   MRN: [id number]  Diabetes He presents for his follow-up diabetic visit. He has type 2 diabetes mellitus. There are no hypoglycemic associated symptoms. There are no diabetic associated symptoms. There are no hypoglycemic complications. There are no diabetic complications. He does not see a podiatrist.Eye exam is not current.    Virtual Visit via Telephone Note  I connected with [place] on 08/20/19 at  3:30 PM EST by telephone and verified that I am speaking with the correct person using two identifiers.  Location: Patient: home Provider: office   I discussed the limitations, risks, security and privacy concerns of performing an evaluation and management service by telephone and the availability of in person appointments. I also discussed with the patient that there may be a patient responsible charge related to this service. The patient expressed understanding and agreed to proceed.   History of Present Illness:    Observations/Objective:   Assessment and Plan:   Follow Up Instructions:    I discussed the assessment and treatment plan with the patient. The patient was provided an opportunity to ask questions and all were answered. The patient agreed with the plan and demonstrated an understanding of the instructions.   The patient was advised to call back or seek an in-person evaluation if the symptoms worsen or if the condition fails to improve as anticipated.  I provided 20 minutes of non-face-to-face time during this encounter.  bp usually good  Patient claims compliance with diabetes medication. No obvious side effects. Reports no substantial low sugar spells. Most numbers are generally in good range when checked fasting. Generally does not miss a dose of medication. Watching diabetic diet closely    Generalized anxiety.  Patient states overall in good control.  Ongoing  stress certainly with his mother's chronic illnesses and him living at home.  Also some ongoing stress with Covid but overall good control    124 this morn  Then later in day, [name], LPN   Review of Systems .rs No headache, no major weight loss or weight gain, no chest pain no back pain abdominal pain no change in bowel habits complete ROS otherwise negative     Objective:   Physical Exam   Virtual     Assessment & Plan:  Impression 1 type 2 diabetes.  Apparent good control.  Await blood work to confirm.  Diet exercise discussed compliance discussed  2.  Generalized anxiety disorder.  Clinically stable per patient maintain same meds  Diet exercise discussed follow-up in 6 months

## 2019-08-22 ENCOUNTER — Encounter: Payer: Self-pay | Admitting: Family Medicine

## 2019-10-13 ENCOUNTER — Encounter: Payer: Self-pay | Admitting: Family Medicine

## 2019-12-07 ENCOUNTER — Telehealth: Payer: Self-pay | Admitting: Internal Medicine

## 2019-12-07 NOTE — Telephone Encounter (Signed)
This seems "triage-y" to me

## 2019-12-07 NOTE — Telephone Encounter (Signed)
Prescribe Canasa BID x 30 days. Needs an OV with me or APP in the next 2 weeks (hasn't been seen in over 6 years. Thanks. JP

## 2019-12-07 NOTE — Telephone Encounter (Signed)
Pt with hx of UC. States he has not had trouble for a while. About a week ago he saw a little bld in his stool. Sunday pm he had some cramping and now he is having 4-5 stools/day and reports they are "nothing but blood." He used a canasa supp last night and that helped with cramps and he is not going to the bathroom as often. Pt wants to know if he should take canasa bid or what he should do. Please advise.

## 2019-12-08 MED ORDER — MESALAMINE 1000 MG RE SUPP: 1000.0000 mg | Freq: Two times a day (BID) | RECTAL | 1 refills | Status: DC

## 2019-12-08 NOTE — Telephone Encounter (Signed)
Pt aware, script sent to pharmacy. Pt scheduled to see Dr. Henrene Pastor 12/22/19@3 :40pm.

## 2019-12-17 ENCOUNTER — Other Ambulatory Visit: Payer: Self-pay | Admitting: Internal Medicine

## 2019-12-22 ENCOUNTER — Encounter: Payer: Self-pay | Admitting: Internal Medicine

## 2019-12-22 ENCOUNTER — Ambulatory Visit (INDEPENDENT_AMBULATORY_CARE_PROVIDER_SITE_OTHER): Payer: BC Managed Care – PPO | Admitting: Internal Medicine

## 2019-12-22 VITALS — BP 132/80 | HR 88 | Temp 98.3°F | Ht 70.0 in | Wt 220.0 lb

## 2019-12-22 DIAGNOSIS — K429 Umbilical hernia without obstruction or gangrene: Secondary | ICD-10-CM

## 2019-12-22 DIAGNOSIS — Z01818 Encounter for other preprocedural examination: Secondary | ICD-10-CM | POA: Diagnosis not present

## 2019-12-22 DIAGNOSIS — K625 Hemorrhage of anus and rectum: Secondary | ICD-10-CM | POA: Diagnosis not present

## 2019-12-22 DIAGNOSIS — K5120 Ulcerative (chronic) proctitis without complications: Secondary | ICD-10-CM | POA: Diagnosis not present

## 2019-12-22 MED ORDER — SUTAB 1479-225-188 MG PO TABS: 1.0000 | ORAL_TABLET | Freq: Once | ORAL | 0 refills | Status: AC

## 2019-12-22 NOTE — Progress Notes (Signed)
HISTORY OF PRESENT ILLNESS:  Jerry Garcia is a 49 y.o. male with a history of mild intermittent ascending colitis and proctitis.  He has not been seen in this office since 2014.  He contacted the office couple weeks ago complaining of significant tenesmus and rectal bleeding.  He was restarted on mesalamine suppositories 1 g daily.  (Written for twice daily, but he had taken it once daily).  Within a few days he reports that his symptoms improved.  His only other complaint is recurrence of a hernia adjacent to prior umbilical hernia repair.  He finds this uncomfortable with lifting.  His last complete colonoscopy was performed August 2014.  No active disease at that time.  Problems with patchy colitis go back to 1992.  He has not received his Covid vaccination  REVIEW OF SYSTEMS:  All non-GI ROS negative except for  Past Medical History:  Diagnosis Date  . Chronic right hip pain   . Diabetes mellitus without complication ([place])   . Ulcerative colitis     Past Surgical History:  Procedure Laterality Date  . COLONOSCOPY  04/30/2013  . HERNIA REPAIR  1950   umbilical  . NASAL SINUS SURGERY      Social History Jerry Garcia  reports that he has never smoked. He has never used smokeless tobacco. He reports that he does not drink alcohol or use drugs.  family history includes Alzheimer's disease in his father; Cancer in his mother; Diabetes in his mother; Hypertension in his mother; Stomach cancer in his paternal uncle.  Allergies  Allergen Reactions  . Oxycodone-Acetaminophen Other (See Comments)    nervousness       PHYSICAL EXAMINATION: Vital signs: BP 132/80   Pulse 88   Temp 98.3 F (36.8 C)   Ht 5' 10"  (1.778 m)   Wt 220 lb (99.8 kg)   BMI 31.57 kg/m   Constitutional: generally well-appearing, no acute distress Psychiatric: alert and oriented x3, cooperative Eyes: extraocular movements intact, anicteric, conjunctiva pink Mouth: oral pharynx moist, no  lesions Neck: supple no lymphadenopathy Cardiovascular: heart regular rate and rhythm, no murmur Lungs: clear to auscultation bilaterally Abdomen: soft, nontender, nondistended, no obvious ascites, no peritoneal signs, normal bowel sounds, no organomegaly.  Small periumbilical hernia Rectal: Deferred to colonoscopy Extremities: no clubbing, cyanosis, or lower extremity edema bilaterally Skin: no lesions on visible extremities Neuro: No focal deficits.  Cranial nerves intact  ASSESSMENT:  1.  History of patchy a sending colitis and proctitis.  Had been doing well until recently when he developed recurrent rectal bleeding and tenesmus.  Suspect recurrence of proctitis.  Last colonoscopy 2014 2.  Periumbilical hernia   PLAN:  1.  Continue Canasa suppositories 1 g daily.  Prescribed 2.  Schedule colonoscopy to evaluate rectal bleeding assess colitis.The nature of the procedure, as well as the risks, benefits, and alternatives were carefully and thoroughly reviewed with the patient. Ample time for discussion and questions allowed. The patient understood, was satisfied, and agreed to proceed. 3.  General surgical referral regarding small abdominal wall hernia (symptomatic).

## 2019-12-22 NOTE — Patient Instructions (Signed)
You have been scheduled for a colonoscopy. Please follow written instructions given to you at your visit today.  Please pick up your prep supplies at the pharmacy within the next 1-3 days. If you use inhalers (even only as needed), please bring them with you on the day of your procedure.  I will send a referral over to [facility]

## 2020-01-05 ENCOUNTER — Other Ambulatory Visit: Payer: Self-pay | Admitting: Internal Medicine

## 2020-01-05 ENCOUNTER — Encounter: Payer: Self-pay | Admitting: Internal Medicine

## 2020-01-05 ENCOUNTER — Other Ambulatory Visit: Payer: Self-pay

## 2020-01-05 ENCOUNTER — Ambulatory Visit (INDEPENDENT_AMBULATORY_CARE_PROVIDER_SITE_OTHER): Payer: BC Managed Care – PPO

## 2020-01-05 DIAGNOSIS — Z1159 Encounter for screening for other viral diseases: Secondary | ICD-10-CM | POA: Diagnosis not present

## 2020-01-06 LAB — SARS CORONAVIRUS 2 (TAT 6-24 HRS): SARS Coronavirus 2: NEGATIVE

## 2020-01-07 ENCOUNTER — Ambulatory Visit (AMBULATORY_SURGERY_CENTER): Payer: BC Managed Care – PPO | Admitting: Internal Medicine

## 2020-01-07 ENCOUNTER — Encounter: Payer: Self-pay | Admitting: Internal Medicine

## 2020-01-07 ENCOUNTER — Other Ambulatory Visit: Payer: Self-pay

## 2020-01-07 VITALS — BP 112/92 | HR 82 | Temp 96.8°F | Resp 10 | Ht 70.0 in | Wt 220.0 lb

## 2020-01-07 DIAGNOSIS — K621 Rectal polyp: Secondary | ICD-10-CM | POA: Diagnosis not present

## 2020-01-07 DIAGNOSIS — Z1211 Encounter for screening for malignant neoplasm of colon: Secondary | ICD-10-CM | POA: Diagnosis not present

## 2020-01-07 DIAGNOSIS — K5120 Ulcerative (chronic) proctitis without complications: Secondary | ICD-10-CM

## 2020-01-07 DIAGNOSIS — D128 Benign neoplasm of rectum: Secondary | ICD-10-CM

## 2020-01-07 MED ORDER — SODIUM CHLORIDE 0.9 % IV SOLN: 500.0000 mL | Freq: Once | INTRAVENOUS | Status: DC

## 2020-01-07 NOTE — Patient Instructions (Signed)
Impression/Recommendations:  [place] handout given to patient.  Resume previous diet. Continue present medications.  Okay to stop Canasa suppositories.  Await pathology results.  Repeat colonoscopy in 5-10 years for surveillance.  YOU HAD AN ENDOSCOPIC PROCEDURE TODAY AT [facility]:   Refer to the procedure report that was given to you for any specific questions about what was found during the examination.  If the procedure report does not answer your questions, please call your gastroenterologist to clarify.  If you requested that your care partner not be given the details of your procedure findings, then the procedure report has been included in a sealed envelope for you to review at your convenience later.  YOU SHOULD EXPECT: Some feelings of bloating in the abdomen. Passage of more gas than usual.  Walking can help get rid of the air that was put into your GI tract during the procedure and reduce the bloating. If you had a lower endoscopy (such as a colonoscopy or flexible sigmoidoscopy) you may notice spotting of blood in your stool or on the toilet paper. If you underwent a bowel prep for your procedure, you may not have a normal bowel movement for a few days.  Please Note:  You might notice some irritation and congestion in your nose or some drainage.  This is from the oxygen used during your procedure.  There is no need for concern and it should clear up in a day or so.  SYMPTOMS TO REPORT IMMEDIATELY:   Following lower endoscopy (colonoscopy or flexible sigmoidoscopy):  Excessive amounts of blood in the stool  Significant tenderness or worsening of abdominal pains  Swelling of the abdomen that is new, acute  Fever of 100F or higher  For urgent or emergent issues, a gastroenterologist can be reached at any hour by calling [phone]. Do not use MyChart messaging for urgent concerns.    DIET:  We do recommend a small meal at first, but then you may proceed  to your regular diet.  Drink plenty of fluids but you should avoid alcoholic beverages for 24 hours.  ACTIVITY:  You should plan to take it easy for the rest of today and you should NOT DRIVE or use heavy machinery until tomorrow (because of the sedation medicines used during the test).    FOLLOW UP: Our staff will call the number listed on your records 48-72 hours following your procedure to check on you and address any questions or concerns that you may have regarding the information given to you following your procedure. If we do not reach you, we will leave a message.  We will attempt to reach you two times.  During this call, we will ask if you have developed any symptoms of COVID 19. If you develop any symptoms (ie: fever, flu-like symptoms, shortness of breath, cough etc.) before then, please call [phone].  If you test positive for Covid 19 in the 2 weeks post procedure, please call and report this information to [place].    If any biopsies were taken you will be contacted by phone or by letter within the next 1-3 weeks.  Please call us at [phone] if you have not heard about the biopsies in 3 weeks.    SIGNATURES/CONFIDENTIALITY: You and/or your care partner have signed paperwork which will be entered into your electronic medical record.  These signatures attest to the fact that that the information above on your After Visit Summary has been reviewed and is understood.  Full  responsibility of the confidentiality of this discharge information lies with you and/or your care-partner.

## 2020-01-07 NOTE — Op Note (Signed)
[name] Patient Name: Jerry Garcia Procedure Date: 01/07/2020 8:38 AM MRN: [id number] Endoscopist: Docia Chuck. Henrene Pastor , MD Age: 49 Referring MD:  Date of Birth: 08/07/1971 Gender: Male Account #: [id number] Procedure:                Colonoscopy with cold snare polypectomy x 1 Indications:              Colon cancer screening (routine). History of mild                            ascending colitis and proctitis. Question recent                            flare. Last exam 2014 Medicines:                Monitored Anesthesia Care Procedure:                Pre-Anesthesia Assessment:                           - Prior to the procedure, a History and Physical                            was performed, and patient medications and                            allergies were reviewed. The patient's tolerance of                            previous anesthesia was also reviewed. The risks                            and benefits of the procedure and the sedation                            options and risks were discussed with the patient.                            All questions were answered, and informed consent                            was obtained. Prior Anticoagulants: The patient has                            taken no previous anticoagulant or antiplatelet                            agents. ASA Grade Assessment: II - A patient with                            mild systemic disease. After reviewing the risks                            and benefits, the patient was deemed in  satisfactory condition to undergo the procedure.                           After obtaining informed consent, the colonoscope                            was passed under direct vision. Throughout the                            procedure, the patient's blood pressure, pulse, and                            oxygen saturations were monitored continuously. The   Colonoscope was introduced through the anus and                            advanced to the the cecum, identified by                            appendiceal orifice and ileocecal valve. The                            ileocecal valve, appendiceal orifice, and rectum                            were photographed. The quality of the bowel                            preparation was excellent. The colonoscopy was                            performed without difficulty. The patient tolerated                            the procedure well. The bowel preparation used was                            SUPREP via split dose instruction. Scope In: 8:45:27 AM Scope Out: 8:58:15 AM Scope Withdrawal Time: 0 hours 9 minutes 4 seconds  Total Procedure Duration: 0 hours 12 minutes 48 seconds  Findings:                 A 1 mm polyp was found in the rectum. The polyp was                            removed with a cold snare. Resection and retrieval                            were complete.                           The exam was otherwise without abnormality on                            direct and retroflexion views.  No evidence of                            active colitis in any portion of the colon.                            Internal hemorrhoids present Complications:            No immediate complications. Estimated blood loss:                            None. Estimated Blood Loss:     Estimated blood loss: none. Impression:               - One 1 mm polyp in the rectum, removed with a cold                            snare. Resected and retrieved.                           - The examination was otherwise normal on direct                            and retroflexion views. There was no evidence of                            active colitis in any portion of the colon.                            Internal hemorrhoids noted. Recommendation:           - Repeat colonoscopy in 5-10 years for surveillance.                            - Patient has a contact number available for                            emergencies. The signs and symptoms of potential                            delayed complications were discussed with the                            patient. Return to normal activities tomorrow.                            Written discharge instructions were provided to the                            patient.                           - Resume previous diet.                           - Continue present medications. Okay to stop [employer]  suppositories                           - Await pathology results. Docia Chuck. Henrene Pastor, MD 01/07/2020 9:06:29 AM This report has been signed electronically.

## 2020-01-07 NOTE — Progress Notes (Signed)
Vitals-CW Temp-JC  Pt's states no medical or surgical changes since previsit or office visit.

## 2020-01-07 NOTE — Progress Notes (Signed)
A/ox3, pleased with MAC, report to RN 

## 2020-01-07 NOTE — Progress Notes (Signed)
Called to room to assist during endoscopic procedure.  Patient ID and intended procedure confirmed with present staff. Received instructions for my participation in the procedure from the performing physician.  

## 2020-01-11 ENCOUNTER — Encounter: Payer: Self-pay | Admitting: Internal Medicine

## 2020-01-11 ENCOUNTER — Telehealth: Payer: Self-pay | Admitting: *Deleted

## 2020-01-11 NOTE — Telephone Encounter (Signed)
  Follow up Call-  Call back number 01/07/2020  Post procedure Call Back phone  # [phone]  Permission to leave phone message Yes  Some recent data might be hidden     Patient questions:  Do you have a fever, pain , or abdominal swelling? No. Pain Score  0 *  Have you tolerated food without any problems? Yes.    Have you been able to return to your normal activities? Yes.    Do you have any questions about your discharge instructions: Diet   No. Medications  No. Follow up visit  No.  Do you have questions or concerns about your Care? No.  Actions: * If pain score is 4 or above: No action needed, pain <4.  1. Have you developed a fever since your procedure? no  2.   Have you had an respiratory symptoms (SOB or cough) since your procedure? no  3.   Have you tested positive for COVID 19 since your procedure no  4.   Have you had any family members/close contacts diagnosed with the COVID 19 since your procedure?  no   If yes to any of these questions please route to Joylene John, RN and Erenest Rasher, RN

## 2020-01-17 ENCOUNTER — Other Ambulatory Visit: Payer: Self-pay | Admitting: Surgery

## 2020-01-17 DIAGNOSIS — K429 Umbilical hernia without obstruction or gangrene: Secondary | ICD-10-CM | POA: Diagnosis not present

## 2020-02-24 ENCOUNTER — Other Ambulatory Visit: Payer: Self-pay | Admitting: Family Medicine

## 2020-02-24 ENCOUNTER — Other Ambulatory Visit: Payer: Self-pay | Admitting: *Deleted

## 2020-02-24 DIAGNOSIS — Z79899 Other long term (current) drug therapy: Secondary | ICD-10-CM

## 2020-02-24 DIAGNOSIS — E119 Type 2 diabetes mellitus without complications: Secondary | ICD-10-CM

## 2020-02-24 DIAGNOSIS — Z13220 Encounter for screening for lipoid disorders: Secondary | ICD-10-CM

## 2020-02-24 DIAGNOSIS — Z125 Encounter for screening for malignant neoplasm of prostate: Secondary | ICD-10-CM

## 2020-02-24 NOTE — Telephone Encounter (Signed)
Last put in system on 08/20/19 but pt did not do. Lipid, liver, bmp, a1c, psa, and mircroalb urine.

## 2020-02-24 NOTE — Telephone Encounter (Signed)
Pt states he does not need any refills at this time and lab orders put in and pt was notified to do before appt

## 2020-02-24 NOTE — Telephone Encounter (Signed)
Pt needs to get labs before more refills, pls call pt to see when he can go?  The we can send small script in. Thx. Dr. Lovena Le

## 2020-02-24 NOTE — Telephone Encounter (Signed)
Pt has follow up on 7/14 but states he does not need refill currently on medication. He would like lab work ordered as well.

## 2020-02-28 ENCOUNTER — Other Ambulatory Visit: Payer: Self-pay

## 2020-02-28 ENCOUNTER — Encounter (HOSPITAL_BASED_OUTPATIENT_CLINIC_OR_DEPARTMENT_OTHER): Payer: Self-pay | Admitting: Surgery

## 2020-03-03 ENCOUNTER — Other Ambulatory Visit (HOSPITAL_COMMUNITY)
Admission: RE | Admit: 2020-03-03 | Discharge: 2020-03-03 | Disposition: A | Discharge status: Home / Self Care | Payer: BC Managed Care – PPO | Source: Ambulatory Visit | Attending: Surgery | Admitting: Surgery

## 2020-03-03 ENCOUNTER — Encounter (HOSPITAL_BASED_OUTPATIENT_CLINIC_OR_DEPARTMENT_OTHER)
Admission: RE | Admit: 2020-03-03 | Discharge: 2020-03-03 | Disposition: A | Discharge status: Home / Self Care | Payer: BC Managed Care – PPO | Source: Ambulatory Visit | Attending: Surgery | Admitting: Surgery

## 2020-03-03 DIAGNOSIS — Z7984 Long term (current) use of oral hypoglycemic drugs: Secondary | ICD-10-CM | POA: Diagnosis not present

## 2020-03-03 DIAGNOSIS — Z01812 Encounter for preprocedural laboratory examination: Secondary | ICD-10-CM | POA: Diagnosis not present

## 2020-03-03 DIAGNOSIS — Z8711 Personal history of peptic ulcer disease: Secondary | ICD-10-CM | POA: Diagnosis not present

## 2020-03-03 DIAGNOSIS — Z79899 Other long term (current) drug therapy: Secondary | ICD-10-CM | POA: Diagnosis not present

## 2020-03-03 DIAGNOSIS — Z833 Family history of diabetes mellitus: Secondary | ICD-10-CM | POA: Diagnosis not present

## 2020-03-03 DIAGNOSIS — F419 Anxiety disorder, unspecified: Secondary | ICD-10-CM | POA: Diagnosis not present

## 2020-03-03 DIAGNOSIS — Z20822 Contact with and (suspected) exposure to covid-19: Secondary | ICD-10-CM | POA: Diagnosis not present

## 2020-03-03 DIAGNOSIS — K429 Umbilical hernia without obstruction or gangrene: Secondary | ICD-10-CM | POA: Diagnosis not present

## 2020-03-03 DIAGNOSIS — E119 Type 2 diabetes mellitus without complications: Secondary | ICD-10-CM | POA: Diagnosis not present

## 2020-03-03 LAB — BASIC METABOLIC PANEL
Anion gap: 11 (ref 5–15)
BUN: 15 mg/dL (ref 6–20)
CO2: 22 mmol/L (ref 22–32)
Calcium: 8.9 mg/dL (ref 8.9–10.3)
Chloride: 103 mmol/L (ref 98–111)
Creatinine, Ser: 0.93 mg/dL (ref 0.61–1.24)
GFR calc Af Amer: >60 mL/min (ref >60)
GFR calc non Af Amer: >60 mL/min (ref >60)
Glucose, Bld: 113 mg/dL — ABNORMAL HIGH (ref 70–99)
Potassium: 3.9 mmol/L (ref 3.5–5.1)
Sodium: 136 mmol/L (ref 135–145)

## 2020-03-03 LAB — SARS CORONAVIRUS 2 (TAT 6-24 HRS): SARS Coronavirus 2: NEGATIVE

## 2020-03-03 MED ORDER — ENSURE PRE-SURGERY PO LIQD: 296.0000 mL | Freq: Once | ORAL | Status: DC

## 2020-03-03 NOTE — Progress Notes (Signed)

## 2020-03-06 NOTE — H&P (Signed)
Jerry Garcia  Location: [facility] Patient #: [id number] DOB: [birth year] Single / Language: Jerry Garcia / Race: White Male   History of Present Illness The patient is a 49 year old male who presents with an umbilical hernia.  Chief complaint: Umbilical hernia  This is a 49 year old gentleman brought performed primary repair of an umbilical hernia 10 years ago without mesh. This is a very small fascial defect. Since then, he has become a diabetic and is on oral medication. He has a history of colitis and just recently had a colonoscopy which showed only benign polyp. He does a lot of heavy lifting at work and noticed a small hernia above the umbilicus over a year ago. It does cause pain with lifting. He has no obstructive symptoms.   Past Surgical History  Colon Polyp Removal - Colonoscopy  [facility] / Umbilical Hernia Surgery  Right.  Diagnostic Studies History Colonoscopy  within last year  Allergies  No Known Drug Allergies   Medication History  Escitalopram Oxalate (10MG Tablet, Oral) Active. metFORMIN HCl (500MG Tablet, Oral) Active. Medications Reconciled  Social History Alcohol use  Occasional alcohol use. Caffeine use  Carbonated beverages, Tea. No drug use  Tobacco use  Never smoker.  Family History  Arthritis  Mother. Cancer  Mother. Colon Polyps  Father. Diabetes Mellitus  Mother. Heart Disease  Father. Heart disease in male family member before age 69  Hypertension  Father. Ovarian Cancer  Mother.  Other Problems  Anxiety Disorder  Diabetes Mellitus  Gastroesophageal Reflux Disease  Hemorrhoids  Ulcerative Colitis  Umbilical Hernia Repair     Review of Systems  General Not Present- Appetite Loss, Chills, Fatigue, Fever, Night Sweats, Weight Gain and Weight Loss. Skin Not Present- Change in Wart/Mole, Dryness, Hives, Jaundice, New Lesions, Non-Healing Wounds, Rash and Ulcer. HEENT Not Present- Earache,  Hearing Loss, Hoarseness, Nose Bleed, Oral Ulcers, Ringing in the Ears, Seasonal Allergies, Sinus Pain, Sore Throat, Visual Disturbances, Wears glasses/contact lenses and Yellow Eyes. Respiratory Not Present- Bloody sputum, Chronic Cough, Difficulty Breathing, Snoring and Wheezing. Breast Not Present- Breast Mass, Breast Pain, Nipple Discharge and Skin Changes. Cardiovascular Not Present- Chest Pain, Difficulty Breathing Lying Down, Leg Cramps, Palpitations, Rapid Heart Rate, Shortness of Breath and Swelling of Extremities. Male Genitourinary Not Present- Blood in Urine, Change in Urinary Stream, Frequency, Impotence, Nocturia, Painful Urination, Urgency and Urine Leakage. Musculoskeletal Not Present- Back Pain, Joint Pain, Joint Stiffness, Muscle Pain, Muscle Weakness and Swelling of Extremities. Neurological Not Present- Decreased Memory, Fainting, Headaches, Numbness, Seizures, Tingling, Tremor, Trouble walking and Weakness. Psychiatric Present- Anxiety. Not Present- Bipolar, Change in Sleep Pattern, Depression, Fearful and Frequent crying. Endocrine Not Present- Cold Intolerance, Excessive Hunger, Hair Changes, Heat Intolerance, Hot flashes and New Diabetes. Hematology Not Present- Blood Thinners, Easy Bruising, Excessive bleeding, Gland problems, HIV and Persistent Infections.  Vitals   Weight: 222 lb Height: 70in Body Surface Area: 2.18 m Body Mass Index: 31.85 kg/m  Temp.: 67F  Pulse: 114 (Regular)  BP: 140/80(Sitting, Left Arm, Standard)     Physical Exam  The physical exam findings are as follows: Note: He appears well today  His abdomen is soft. He does have a rectus diastases. There is a small fascial defect with a hernia which is reducible above the umbilicus. This is above his old scar. It is nontender today.    Assessment & Plan   UMBILICAL HERNIA WITHOUT OBSTRUCTION OR GANGRENE (K42.9)  Impression: I discussed the diagnosis with him. It is difficult to  tell whether this is a recurrence or a new hernia as it may be in a separate location. Nonetheless, repair with mesh this time is recommended. He has had slight increase of hernia recurrences given his weight and diabetes. I discussed proceeding with an umbilical hernia repair with mesh. I discussed procedure in detail. I discussed the risk which includes but is not limited to bleeding, infection, injury to surrounding structures, use of mesh, hernia recurrence, postoperative recovery, etc. I would have him refrain from lifting over 15-20 pounds for at least 6 weeks after surgery. He understands and agrees to proceed with surgery which will be scheduled.

## 2020-03-07 ENCOUNTER — Ambulatory Visit (HOSPITAL_BASED_OUTPATIENT_CLINIC_OR_DEPARTMENT_OTHER): Payer: BC Managed Care – PPO | Admitting: Certified Registered"

## 2020-03-07 ENCOUNTER — Other Ambulatory Visit: Payer: Self-pay

## 2020-03-07 ENCOUNTER — Encounter (HOSPITAL_BASED_OUTPATIENT_CLINIC_OR_DEPARTMENT_OTHER): Payer: Self-pay | Admitting: Surgery

## 2020-03-07 ENCOUNTER — Ambulatory Visit (HOSPITAL_BASED_OUTPATIENT_CLINIC_OR_DEPARTMENT_OTHER)
Admission: RE | Admit: 2020-03-07 | Discharge: 2020-03-07 | Disposition: A | Discharge status: Home / Self Care | Payer: BC Managed Care – PPO | Source: Ambulatory Visit | Attending: Surgery | Admitting: Surgery

## 2020-03-07 ENCOUNTER — Encounter (HOSPITAL_BASED_OUTPATIENT_CLINIC_OR_DEPARTMENT_OTHER)
Admission: RE | Disposition: A | Discharge status: Home / Self Care | Payer: Self-pay | Source: Ambulatory Visit | Attending: Surgery

## 2020-03-07 DIAGNOSIS — Z8711 Personal history of peptic ulcer disease: Secondary | ICD-10-CM | POA: Insufficient documentation

## 2020-03-07 DIAGNOSIS — E119 Type 2 diabetes mellitus without complications: Secondary | ICD-10-CM | POA: Diagnosis not present

## 2020-03-07 DIAGNOSIS — F419 Anxiety disorder, unspecified: Secondary | ICD-10-CM | POA: Diagnosis not present

## 2020-03-07 DIAGNOSIS — Z833 Family history of diabetes mellitus: Secondary | ICD-10-CM | POA: Diagnosis not present

## 2020-03-07 DIAGNOSIS — Z79899 Other long term (current) drug therapy: Secondary | ICD-10-CM | POA: Insufficient documentation

## 2020-03-07 DIAGNOSIS — Z7984 Long term (current) use of oral hypoglycemic drugs: Secondary | ICD-10-CM | POA: Diagnosis not present

## 2020-03-07 DIAGNOSIS — K429 Umbilical hernia without obstruction or gangrene: Secondary | ICD-10-CM | POA: Insufficient documentation

## 2020-03-07 HISTORY — PX: INSERTION OF MESH: SHX5868

## 2020-03-07 HISTORY — PX: UMBILICAL HERNIA REPAIR: SHX196

## 2020-03-07 LAB — GLUCOSE, CAPILLARY
Glucose-Capillary: 133 mg/dL — ABNORMAL HIGH (ref 70–99)
Glucose-Capillary: 115 mg/dL — ABNORMAL HIGH (ref 70–99)

## 2020-03-07 SURGERY — REPAIR, HERNIA, UMBILICAL, ADULT
Anesthesia: General | Site: Abdomen

## 2020-03-07 MED ORDER — HYDROMORPHONE HCL 1 MG/ML IJ SOLN: 0.2500 mg | INTRAMUSCULAR | Status: DC | PRN

## 2020-03-07 MED ORDER — CHLORHEXIDINE GLUCONATE CLOTH 2 % EX PADS: 6.0000 | MEDICATED_PAD | Freq: Once | CUTANEOUS | Status: DC

## 2020-03-07 MED ORDER — ACETAMINOPHEN 500 MG PO TABS
ORAL_TABLET | ORAL | Status: AC
  Filled 2020-03-07: qty 2

## 2020-03-07 MED ORDER — PROPOFOL 10 MG/ML IV BOLUS
INTRAVENOUS | Status: DC | PRN
  Administered 2020-03-07: 200 mg via INTRAVENOUS

## 2020-03-07 MED ORDER — PROPOFOL 10 MG/ML IV BOLUS
INTRAVENOUS | Status: AC
  Filled 2020-03-07: qty 20

## 2020-03-07 MED ORDER — EPHEDRINE SULFATE 50 MG/ML IJ SOLN
INTRAMUSCULAR | Status: DC | PRN
  Administered 2020-03-07: 10 mg via INTRAVENOUS

## 2020-03-07 MED ORDER — LIDOCAINE 2% (20 MG/ML) 5 ML SYRINGE
INTRAMUSCULAR | Status: AC
  Filled 2020-03-07: qty 5

## 2020-03-07 MED ORDER — LACTATED RINGERS IV SOLN: INTRAVENOUS | Status: DC

## 2020-03-07 MED ORDER — ROCURONIUM BROMIDE 10 MG/ML (PF) SYRINGE
PREFILLED_SYRINGE | INTRAVENOUS | Status: AC
  Filled 2020-03-07: qty 10

## 2020-03-07 MED ORDER — ACETAMINOPHEN 500 MG PO TABS
1000.0000 mg | ORAL_TABLET | ORAL | Status: AC
  Administered 2020-03-07: 1000 mg via ORAL

## 2020-03-07 MED ORDER — HYDROCODONE-ACETAMINOPHEN 5-325 MG PO TABS: 1.0000 | ORAL_TABLET | Freq: Four times a day (QID) | ORAL | 0 refills | Status: DC | PRN

## 2020-03-07 MED ORDER — MIDAZOLAM HCL 5 MG/5ML IJ SOLN
INTRAMUSCULAR | Status: DC | PRN
  Administered 2020-03-07: 2 mg via INTRAVENOUS

## 2020-03-07 MED ORDER — MEPERIDINE HCL 25 MG/ML IJ SOLN: 6.2500 mg | INTRAMUSCULAR | Status: DC | PRN

## 2020-03-07 MED ORDER — FENTANYL CITRATE (PF) 100 MCG/2ML IJ SOLN
INTRAMUSCULAR | Status: DC | PRN
  Administered 2020-03-07: 100 ug via INTRAVENOUS

## 2020-03-07 MED ORDER — PROMETHAZINE HCL 25 MG/ML IJ SOLN: 6.2500 mg | INTRAMUSCULAR | Status: DC | PRN

## 2020-03-07 MED ORDER — GABAPENTIN 300 MG PO CAPS
300.0000 mg | ORAL_CAPSULE | ORAL | Status: AC
  Administered 2020-03-07: 300 mg via ORAL

## 2020-03-07 MED ORDER — ONDANSETRON HCL 4 MG/2ML IJ SOLN
INTRAMUSCULAR | Status: DC | PRN
  Administered 2020-03-07: 4 mg via INTRAVENOUS

## 2020-03-07 MED ORDER — ONDANSETRON HCL 4 MG/2ML IJ SOLN
INTRAMUSCULAR | Status: AC
  Filled 2020-03-07: qty 2

## 2020-03-07 MED ORDER — CELECOXIB 200 MG PO CAPS
ORAL_CAPSULE | ORAL | Status: AC
  Filled 2020-03-07: qty 2

## 2020-03-07 MED ORDER — FENTANYL CITRATE (PF) 100 MCG/2ML IJ SOLN
INTRAMUSCULAR | Status: AC
  Filled 2020-03-07: qty 2

## 2020-03-07 MED ORDER — GABAPENTIN 300 MG PO CAPS
ORAL_CAPSULE | ORAL | Status: AC
  Filled 2020-03-07: qty 1

## 2020-03-07 MED ORDER — MIDAZOLAM HCL 2 MG/2ML IJ SOLN
INTRAMUSCULAR | Status: AC
  Filled 2020-03-07: qty 2

## 2020-03-07 MED ORDER — CELECOXIB 200 MG PO CAPS
400.0000 mg | ORAL_CAPSULE | ORAL | Status: AC
  Administered 2020-03-07: 400 mg via ORAL

## 2020-03-07 MED ORDER — SUGAMMADEX SODIUM 500 MG/5ML IV SOLN
INTRAVENOUS | Status: DC | PRN
Start: 2020-03-07 — End: 2020-03-07
  Administered 2020-03-07: 400 mg via INTRAVENOUS

## 2020-03-07 MED ORDER — AMISULPRIDE (ANTIEMETIC) 5 MG/2ML IV SOLN: 10.0000 mg | Freq: Once | INTRAVENOUS | Status: DC | PRN

## 2020-03-07 MED ORDER — DEXAMETHASONE SODIUM PHOSPHATE 10 MG/ML IJ SOLN
INTRAMUSCULAR | Status: AC
  Filled 2020-03-07: qty 1

## 2020-03-07 MED ORDER — CEFAZOLIN SODIUM-DEXTROSE 2-4 GM/100ML-% IV SOLN
INTRAVENOUS | Status: AC
  Filled 2020-03-07: qty 100

## 2020-03-07 MED ORDER — LIDOCAINE HCL (CARDIAC) PF 100 MG/5ML IV SOSY
PREFILLED_SYRINGE | INTRAVENOUS | Status: DC | PRN
  Administered 2020-03-07: 100 mg via INTRAVENOUS

## 2020-03-07 MED ORDER — CEFAZOLIN SODIUM-DEXTROSE 2-4 GM/100ML-% IV SOLN: 2.0000 g | INTRAVENOUS | Status: DC

## 2020-03-07 MED ORDER — ROCURONIUM BROMIDE 100 MG/10ML IV SOLN
INTRAVENOUS | Status: DC | PRN
  Administered 2020-03-07: 100 mg via INTRAVENOUS

## 2020-03-07 MED ORDER — BUPIVACAINE HCL 0.5 % IJ SOLN
INTRAMUSCULAR | Status: DC | PRN
  Administered 2020-03-07: 20 mL

## 2020-03-07 SURGICAL SUPPLY — 41 items
ADH SKN CLS APL DERMABOND .7 (GAUZE/BANDAGES/DRESSINGS) ×2
APL PRP STRL LF DISP 70% ISPRP (MISCELLANEOUS) ×2
BLADE CLIPPER SURG (BLADE) ×2 IMPLANT
BLADE SURG 15 STRL LF DISP TIS (BLADE) ×2 IMPLANT
BLADE SURG 15 STRL SS (BLADE) ×4
CANISTER SUCT 1200ML W/VALVE (MISCELLANEOUS) IMPLANT
CHLORAPREP W/TINT 26 (MISCELLANEOUS) ×4 IMPLANT
COVER BACK TABLE 60X90IN (DRAPES) ×4 IMPLANT
COVER MAYO STAND STRL (DRAPES) ×4 IMPLANT
DERMABOND ADVANCED (GAUZE/BANDAGES/DRESSINGS) ×2
DERMABOND ADVANCED .7 DNX12 (GAUZE/BANDAGES/DRESSINGS) ×4 IMPLANT
DRAPE LAPAROTOMY 100X72 PEDS (DRAPES) ×4 IMPLANT
DRAPE UTILITY XL STRL (DRAPES) ×4 IMPLANT
ELECT REM PT RETURN 9FT ADLT (ELECTROSURGICAL) ×4
ELECTRODE REM PT RTRN 9FT ADLT (ELECTROSURGICAL) ×2 IMPLANT
GLOVE SURG SIGNA 7.5 PF LTX (GLOVE) ×4 IMPLANT
GOWN STRL REUS W/ TWL LRG LVL3 (GOWN DISPOSABLE) ×2 IMPLANT
GOWN STRL REUS W/ TWL XL LVL3 (GOWN DISPOSABLE) ×2 IMPLANT
GOWN STRL REUS W/TWL LRG LVL3 (GOWN DISPOSABLE) ×4
GOWN STRL REUS W/TWL XL LVL3 (GOWN DISPOSABLE) ×4
MESH VENTRALEX ST 1-7/10 CRC S (Mesh General) ×2 IMPLANT
NDL HYPO 25X1 1.5 SAFETY (NEEDLE) ×2 IMPLANT
NEEDLE HYPO 25X1 1.5 SAFETY (NEEDLE) ×4 IMPLANT
NS IRRIG 1000ML POUR BTL (IV SOLUTION) ×2 IMPLANT
PENCIL SMOKE EVACUATOR (MISCELLANEOUS) ×4 IMPLANT
SET BASIN DAY SURGERY F.S. (CUSTOM PROCEDURE TRAY) ×4 IMPLANT
SLEEVE SCD COMPRESS KNEE MED (MISCELLANEOUS) ×4 IMPLANT
SPONGE LAP 4X18 RFD (DISPOSABLE) ×2 IMPLANT
SUT MNCRL AB 4-0 PS2 18 (SUTURE) ×4 IMPLANT
SUT NOVA 0 T19/GS 22DT (SUTURE) ×2 IMPLANT
SUT NOVA NAB DX-16 0-1 5-0 T12 (SUTURE) IMPLANT
SUT NOVA NAB GS-21 1 T12 (SUTURE) IMPLANT
SUT VIC AB 2-0 SH 27 (SUTURE)
SUT VIC AB 2-0 SH 27XBRD (SUTURE) IMPLANT
SUT VIC AB 3-0 SH 27 (SUTURE) ×4
SUT VIC AB 3-0 SH 27X BRD (SUTURE) ×2 IMPLANT
SYR CONTROL 10ML LL (SYRINGE) ×4 IMPLANT
TOWEL GREEN STERILE FF (TOWEL DISPOSABLE) ×4 IMPLANT
TUBE CONNECTING 20'X1/4 (TUBING)
TUBE CONNECTING 20X1/4 (TUBING) IMPLANT
YANKAUER SUCT BULB TIP NO VENT (SUCTIONS) IMPLANT

## 2020-03-07 NOTE — Discharge Instructions (Signed)
[facility] _______[facility]  UMBILICAL OR INGUINAL HERNIA REPAIR: POST OP INSTRUCTIONS  Always review your discharge instruction sheet given to you by the facility where your surgery was performed. IF YOU HAVE DISABILITY OR FAMILY LEAVE FORMS, YOU MUST BRING THEM TO THE OFFICE FOR PROCESSING.   DO NOT GIVE THEM TO YOUR DOCTOR.  1. A  prescription for pain medication may be given to you upon discharge.  Take your pain medication as prescribed, if needed.  If narcotic pain medicine is not needed, then you may take acetaminophen (Tylenol) or ibuprofen (Advil) as needed. 2. Take your usually prescribed medications unless otherwise directed. If you need a refill on your pain medication, please contact your pharmacy.  They will contact our office to request authorization. Prescriptions will not be filled after 5 pm or on week-ends. 3. You should follow a light diet the first 24 hours after arrival home, such as soup and crackers, etc.  Be sure to include lots of fluids daily.  Resume your normal diet the day after surgery. 4.Most patients will experience some swelling and bruising around the umbilicus or in the groin and scrotum.  Ice packs and reclining will help.  Swelling and bruising can take several days to resolve.  6. It is common to experience some constipation if taking pain medication after surgery.  Increasing fluid intake and taking a stool softener (such as Colace) will usually help or prevent this problem from occurring.  A mild laxative (Milk of Magnesia or Miralax) should be taken according to package directions if there are no bowel movements after 48 hours. 7. Unless discharge instructions indicate otherwise, you may remove your bandages 24-48 hours after surgery, and you may shower at that time.  You may have steri-strips (small skin tapes) in place directly over the incision.  These strips should be left on the skin for 7-10 days.  If your surgeon used skin glue on the  incision, you may shower in 24 hours.  The glue will flake off over the next 2-3 weeks.  Any sutures or staples will be removed at the office during your follow-up visit. 8. ACTIVITIES:  You may resume regular (light) daily activities beginning the next day--such as daily self-care, walking, climbing stairs--gradually increasing activities as tolerated.  You may have sexual intercourse when it is comfortable.  Refrain from any heavy lifting or straining until approved by your doctor.  a.You may drive when you are no longer taking prescription pain medication, you can comfortably wear a seatbelt, and you can safely maneuver your car and apply brakes. b.RETURN TO WORK:   _____________________________________________  9.You should see your doctor in the office for a follow-up appointment approximately 2-3 weeks after your surgery.  Make sure that you call for this appointment within a day or two after you arrive home to insure a convenient appointment time. 10.OTHER INSTRUCTIONS: ___OK TO SHOWER STARTING TOMORROW ICE PACK, TYLENOL, AND IBUPROFEN ALSO FOR PAIN NO LIFTING MORE THAN 14 POUNDS FOR 4 WEEKS______________________    _____________________________________  WHEN TO CALL YOUR DOCTOR: 1. Fever over 101.0 2. Inability to urinate 3. Nausea and/or vomiting 4. Extreme swelling or bruising 5. Continued bleeding from incision. 6. Increased pain, redness, or drainage from the incision  The clinic staff is available to answer your questions during regular business hours.  Please don't hesitate to call and ask to speak to one of the nurses for clinical concerns.  If you have a medical emergency, go to the nearest  emergency room or call 911.  A surgeon from [facility] is always on call at the hospital   [address] [place], [place], [place]  [address] ?  [address], [place], [place]   [address] [phone] ? [phone] ? FAX [phone] Web site:  [email or web address]      Post Anesthesia Home Care Instructions  Activity: Get plenty of rest for the remainder of the day. A responsible individual must stay with you for 24 hours following the procedure.  For the next 24 hours, DO NOT: -Drive a car -Paediatric nurse -Drink alcoholic beverages -Take any medication unless instructed by your physician -Make any legal decisions or sign important papers.  Meals: Start with liquid foods such as gelatin or soup. Progress to regular foods as tolerated. Avoid greasy, spicy, heavy foods. If nausea and/or vomiting occur, drink only clear liquids until the nausea and/or vomiting subsides. Call your physician if vomiting continues.  Special Instructions/Symptoms: Your throat may feel dry or sore from the anesthesia or the breathing tube placed in your throat during surgery. If this causes discomfort, gargle with warm salt water. The discomfort should disappear within 24 hours.    No Tylenol until 3:30 pm if needed. No Ibuprofen/Motrin until 5:30pm if needed.

## 2020-03-07 NOTE — Anesthesia Postprocedure Evaluation (Signed)
Anesthesia Post Note  Patient: Jerry Garcia  Procedure(s) Performed: UMBILICAL HERNIA REPAIR WITH MESH (N/A Abdomen) INSERTION OF MESH (Abdomen)     Patient location during evaluation: PACU Anesthesia Type: General Level of consciousness: awake and alert Pain management: pain level controlled Vital Signs Assessment: post-procedure vital signs reviewed and stable Respiratory status: spontaneous breathing, nonlabored ventilation and respiratory function stable Cardiovascular status: blood pressure returned to baseline and stable Postop Assessment: no apparent nausea or vomiting Anesthetic complications: no   No complications documented.  Last Vitals:  Vitals:   03/07/20 1056 03/07/20 1123  BP: 119/74 112/80  Pulse: 95 98  Resp: 17 18  Temp:  36.8 C  SpO2: 97% 97%    Last Pain:  Vitals:   03/07/20 1123  TempSrc: Oral  PainSc: 2                  Lynda Rainwater

## 2020-03-07 NOTE — Op Note (Signed)
UMBILICAL HERNIA REPAIR WITH MESH, INSERTION OF MESH  Procedure Note  Jerry Garcia 03/07/2020   Pre-op Diagnosis: UMBILICAL HERNIA     Post-op Diagnosis: same  Procedure(s): UMBILICAL HERNIA REPAIR WITH MESH INSERTION OF MESH  Surgeon(s): Coralie Keens, MD  Anesthesia: General  Staff:  Circulator: McDonough-Hughes, Delene Ruffini, RN Scrub Person: Jackie Plum Circulator Assistant: Izora Ribas, RN  Estimated Blood Loss: Minimal               Procedure: The patient was brought to the operating room and identifies correct patient.  He was placed upon the operating room table and general anesthesia was induced.  His abdomen was prepped and draped in usual sterile fashion.  I anesthetized skin above the umbilicus with Marcaine and made a longitudinal incision with a scalpel.  I then took this down to the hernia sac and fascial defect with the cautery.  I excised the sac.  The hernia was found to contain only omentum which I reduced back into the abdominal cavity.  The previous primary repair of her hernia that was below the umbilicus was intact.  I brought a 4.3 cm round ventral Prolene patch from [facility] onto the field.  It was placed through the fascia opening and then pulled up against the peritoneum with stay ties.  The fascial opening which is slightly larger than a centimeter in size.  I sutured the mesh in place with #1 Novafil suture circumferentially.  I then cut the ties and closed the fascia over the top of the mesh with a figure-of-eight #1 Novafil suture.  I anesthetized the fascia further with Marcaine.  I then closed subtenons tissue with interrupted 3-0 Vicryl sutures and closed skin with running 4-0 Monocryl.  Dermabond was then applied.  The patient tolerated the procedure well.  All the counts were correct at the end of the procedure.  The patient was then extubated in the operating room and taken in stable condition to the recovery room.          Coralie Keens    Date: 03/07/2020  Time: 10:28 AM

## 2020-03-07 NOTE — Transfer of Care (Signed)
Immediate Anesthesia Transfer of Care Note  Patient: Jerry Garcia  Procedure(s) Performed: UMBILICAL HERNIA REPAIR WITH MESH (N/A Abdomen) INSERTION OF MESH (Abdomen)  Patient Location: PACU  Anesthesia Type:General  Level of Consciousness: awake, alert  and oriented  Airway & Oxygen Therapy: Patient Spontanous Breathing and Patient connected to face mask oxygen  Post-op Assessment: Report given to RN and Post -op Vital signs reviewed and stable  Post vital signs: Reviewed and stable  Last Vitals:  Vitals Value Taken Time  BP 131/79 03/07/20 1033  Temp    Pulse 96 03/07/20 1034  Resp 16 03/07/20 1034  SpO2 100 % 03/07/20 1034  Vitals shown include unvalidated device data.  Last Pain:  Vitals:   03/07/20 0914  TempSrc: Oral  PainSc: 0-No pain         Complications: No complications documented.

## 2020-03-07 NOTE — Interval H&P Note (Signed)
History and Physical Interval Note: no change in H and P  03/07/2020 9:30 AM  Adem L Higbie  has presented today for surgery, with the diagnosis of UMBILICAL HERNIA.  The various methods of treatment have been discussed with the patient and family. After consideration of risks, benefits and other options for treatment, the patient has consented to  Procedure(s): UMBILICAL HERNIA REPAIR WITH MESH (N/A) as a surgical intervention.  The patient's history has been reviewed, patient examined, no change in status, stable for surgery.  I have reviewed the patient's chart and labs.  Questions were answered to the patient's satisfaction.     Coralie Keens

## 2020-03-07 NOTE — Anesthesia Preprocedure Evaluation (Signed)
Anesthesia Evaluation  Patient identified by MRN, date of birth, ID band Patient awake    Reviewed: Allergy & Precautions, NPO status , Patient's Chart, lab work & pertinent test results  Airway Mallampati: II  TM Distance: >3 FB Neck ROM: Full    Dental no notable dental hx.    Pulmonary neg pulmonary ROS,    Pulmonary exam normal breath sounds clear to auscultation       Cardiovascular negative cardio ROS Normal cardiovascular exam Rhythm:Regular Rate:Normal     Neuro/Psych Anxiety negative neurological ROS  negative psych ROS   GI/Hepatic Neg liver ROS, PUD, GERD  ,  Endo/Other  negative endocrine ROSdiabetes, Type 2  Renal/GU negative Renal ROS  negative genitourinary   Musculoskeletal negative musculoskeletal ROS (+)   Abdominal (+) + obese,   Peds negative pediatric ROS (+)  Hematology negative hematology ROS (+)   Anesthesia Other Findings   Reproductive/Obstetrics negative OB ROS                             Anesthesia Physical Anesthesia Plan  ASA: III  Anesthesia Plan: General   Post-op Pain Management:    Induction: Intravenous  PONV Risk Score and Plan: 2 and Ondansetron, Midazolam and Treatment may vary due to age or medical condition  Airway Management Planned: Oral ETT  Additional Equipment:   Intra-op Plan:   Post-operative Plan: Extubation in OR  Informed Consent: I have reviewed the patients History and Physical, chart, labs and discussed the procedure including the risks, benefits and alternatives for the proposed anesthesia with the patient or authorized representative who has indicated his/her understanding and acceptance.     Dental advisory given  Plan Discussed with: CRNA  Anesthesia Plan Comments:         Anesthesia Quick Evaluation

## 2020-03-07 NOTE — Anesthesia Procedure Notes (Signed)
Procedure Name: Intubation Performed by: Verita Lamb, CRNA Pre-anesthesia Checklist: Patient identified, Emergency Drugs available, Suction available and Patient being monitored Patient Re-evaluated:Patient Re-evaluated prior to induction Oxygen Delivery Method: Circle system utilized Preoxygenation: Pre-oxygenation with 100% oxygen Induction Type: IV induction Ventilation: Mask ventilation without difficulty Tube type: Oral Tube size: 7.0 mm Number of attempts: 1 Airway Equipment and Method: Stylet and Oral airway Placement Confirmation: ETT inserted through vocal cords under direct vision,  positive ETCO2 and breath sounds checked- equal and bilateral Tube secured with: Tape Dental Injury: Teeth and Oropharynx as per pre-operative assessment

## 2020-03-08 ENCOUNTER — Encounter (HOSPITAL_BASED_OUTPATIENT_CLINIC_OR_DEPARTMENT_OTHER): Payer: Self-pay | Admitting: Surgery

## 2020-03-22 ENCOUNTER — Ambulatory Visit: Payer: BC Managed Care – PPO | Admitting: Family Medicine

## 2020-04-21 DIAGNOSIS — Z79899 Other long term (current) drug therapy: Secondary | ICD-10-CM | POA: Diagnosis not present

## 2020-04-21 DIAGNOSIS — Z125 Encounter for screening for malignant neoplasm of prostate: Secondary | ICD-10-CM | POA: Diagnosis not present

## 2020-04-21 DIAGNOSIS — Z13220 Encounter for screening for lipoid disorders: Secondary | ICD-10-CM | POA: Diagnosis not present

## 2020-04-21 DIAGNOSIS — E119 Type 2 diabetes mellitus without complications: Secondary | ICD-10-CM | POA: Diagnosis not present

## 2020-04-22 LAB — MICROALBUMIN / CREATININE URINE RATIO
Creatinine, Urine: 194.7 mg/dL
Microalb/Creat Ratio: 11 mg/g creat (ref 0–29)
Microalbumin, Urine: 20.7 ug/mL

## 2020-04-22 LAB — LIPID PANEL
Chol/HDL Ratio: 3.7 ratio (ref 0.0–5.0)
Cholesterol, Total: 227 mg/dL — ABNORMAL HIGH (ref 100–199)
HDL: 62 mg/dL (ref >39)
LDL Chol Calc (NIH): 152 mg/dL — ABNORMAL HIGH (ref 0–99)
Triglycerides: 75 mg/dL (ref 0–149)
VLDL Cholesterol Cal: 13 mg/dL (ref 5–40)

## 2020-04-22 LAB — HEPATIC FUNCTION PANEL
ALT: 45 IU/L — ABNORMAL HIGH (ref 0–44)
AST: 37 IU/L (ref 0–40)
Albumin: 4.7 g/dL (ref 3.8–4.9)
Alkaline Phosphatase: 87 IU/L (ref 48–121)
Bilirubin Total: 0.4 mg/dL (ref 0.0–1.2)
Bilirubin, Direct: 0.12 mg/dL (ref 0.00–0.40)
Total Protein: 7.1 g/dL (ref 6.0–8.5)

## 2020-04-22 LAB — HEMOGLOBIN A1C
Est. average glucose Bld gHb Est-mCnc: 171 mg/dL
Hgb A1c MFr Bld: 7.6 % — ABNORMAL HIGH (ref 4.8–5.6)

## 2020-04-22 LAB — BASIC METABOLIC PANEL
BUN/Creatinine Ratio: 15 (ref 9–20)
BUN: 15 mg/dL (ref 6–24)
CO2: 25 mmol/L (ref 20–29)
Calcium: 9.5 mg/dL (ref 8.7–10.2)
Chloride: 100 mmol/L (ref 96–106)
Creatinine, Ser: 1.03 mg/dL (ref 0.76–1.27)
GFR calc Af Amer: 97 mL/min/{1.73_m2} (ref >59)
GFR calc non Af Amer: 84 mL/min/{1.73_m2} (ref >59)
Glucose: 90 mg/dL (ref 65–99)
Potassium: 5.1 mmol/L (ref 3.5–5.2)
Sodium: 138 mmol/L (ref 134–144)

## 2020-04-22 LAB — PSA: Prostate Specific Ag, Serum: 0.5 ng/mL (ref 0.0–4.0)

## 2020-04-25 ENCOUNTER — Encounter: Payer: Self-pay | Admitting: Family Medicine

## 2020-04-25 ENCOUNTER — Ambulatory Visit (INDEPENDENT_AMBULATORY_CARE_PROVIDER_SITE_OTHER): Payer: BC Managed Care – PPO | Admitting: Family Medicine

## 2020-04-25 ENCOUNTER — Other Ambulatory Visit: Payer: Self-pay

## 2020-04-25 VITALS — BP 130/80 | HR 88 | Temp 97.1°F | Ht 70.0 in | Wt 221.4 lb

## 2020-04-25 DIAGNOSIS — E119 Type 2 diabetes mellitus without complications: Secondary | ICD-10-CM | POA: Diagnosis not present

## 2020-04-25 DIAGNOSIS — F411 Generalized anxiety disorder: Secondary | ICD-10-CM | POA: Diagnosis not present

## 2020-04-25 DIAGNOSIS — M25561 Pain in right knee: Secondary | ICD-10-CM | POA: Diagnosis not present

## 2020-04-25 DIAGNOSIS — R030 Elevated blood-pressure reading, without diagnosis of hypertension: Secondary | ICD-10-CM

## 2020-04-25 MED ORDER — BLOOD GLUCOSE MONITOR KIT: PACK | 0 refills | Status: AC

## 2020-04-25 MED ORDER — METFORMIN HCL 500 MG PO TABS: ORAL_TABLET | ORAL | 1 refills | Status: DC

## 2020-04-25 MED ORDER — ESCITALOPRAM OXALATE 10 MG PO TABS: 10.0000 mg | ORAL_TABLET | Freq: Every day | ORAL | 1 refills | Status: DC

## 2020-04-25 NOTE — Progress Notes (Signed)
Patient ID: Jerry Garcia, male    DOB: 02-Sep-1971, 49 y.o.   MRN: [id number]   Chief Complaint  Patient presents with  . Diabetes    follow up- discuss recent labs- referral to Dr Dorothe Pea for knee pain   Subjective:    HPI Seen for f/u on DM2.  Dm2- Compliant with medications. Checking blood glucose.   Denies polyuria or polydipsia.  Eye exam- pt is due. Feeling bg is " all over the place." Seeing fasting bg 99-108, and today at 146.   Had chicken salad sub and large pepsi prior to the bg check. And last night had dec caff pepsi, and next morning had high BG.  Taking lexapro for anxiety, doing well. Not having any side effects.   H/o ulcerative colitis- stopped the suppositories and not needing it after last colonoscopy.   Right knee pain- for past 6 months. Wanting to see ortho, Dr. Noemi Chapel.  No trauma or injury. takign tylenol prn and ace. Pain with going up and down stairs, pain in front and behind the knee. More pain with going up stairs, no locking out or giving out.   Medical History Jerry Garcia has a past medical history of Chronic right hip pain, Diabetes mellitus without complication ([place]), and Ulcerative colitis.   Outpatient Encounter Medications as of 04/25/2020  Medication Sig  . ACCU-CHEK AVIVA PLUS test strip TEST ONCE DAILY  . acetaminophen (TYLENOL) 325 MG tablet Take 650 mg by mouth every 6 (six) hours as needed for moderate pain.  . blood glucose meter kit and supplies KIT Dispense based on patient and insurance preference. Tests once daily as directed. (FOR ICD-10- E11.9)  . escitalopram (LEXAPRO) 10 MG tablet Take 1 tablet (10 mg total) by mouth daily.  Marland Kitchen HYDROcodone-acetaminophen (NORCO) 5-325 MG tablet Take 1 tablet by mouth every 6 (six) hours as needed for moderate pain or severe pain.  . mesalamine (CANASA) 1000 MG suppository Place 1 suppository (1,000 mg total) rectally at bedtime. (Patient taking differently: Place 1,000 mg rectally daily as  needed (flare up). )  . metFORMIN (GLUCOPHAGE) 500 MG tablet Take one tablet by mouth twice daily  . [DISCONTINUED] blood glucose meter kit and supplies KIT Dispense based on patient and insurance preference. Tests once daily as directed. (FOR ICD-10- E11.9)  . [DISCONTINUED] escitalopram (LEXAPRO) 10 MG tablet Take 1 tablet (10 mg total) by mouth daily.  . [DISCONTINUED] metFORMIN (GLUCOPHAGE) 500 MG tablet Take one tablet by mouth twice daily   No facility-administered encounter medications on file as of 04/25/2020.     Review of Systems  Constitutional: Negative for chills and fever.  HENT: Negative for congestion, rhinorrhea and sore throat.   Respiratory: Negative for cough, shortness of breath and wheezing.   Cardiovascular: Negative for chest pain and leg swelling.  Gastrointestinal: Negative for abdominal pain, diarrhea, nausea and vomiting.  Genitourinary: Negative for dysuria and frequency.  Skin: Negative for rash.  Neurological: Negative for dizziness, weakness and headaches.     Vitals BP 130/80   Pulse 88   Temp (!) 97.1 F (36.2 C) (Oral)   Ht 5' 10"  (1.778 m)   Wt 221 lb 6.4 oz (100.4 kg)   SpO2 100%   BMI 31.77 kg/m   Objective:   Physical Exam Vitals and nursing note reviewed.  Constitutional:      General: He is not in acute distress.    Appearance: Normal appearance. He is not ill-appearing.  HENT:     Head:  Normocephalic.     Nose: Nose normal. No congestion.     Mouth/Throat:     Mouth: Mucous membranes are moist.     Pharynx: No oropharyngeal exudate.  Eyes:     Extraocular Movements: Extraocular movements intact.     Conjunctiva/sclera: Conjunctivae normal.     Pupils: Pupils are equal, round, and reactive to light.  Cardiovascular:     Rate and Rhythm: Normal rate and regular rhythm.     Pulses: Normal pulses.     Heart sounds: Normal heart sounds. No murmur heard.   Pulmonary:     Effort: Pulmonary effort is normal.     Breath sounds:  Normal breath sounds. No wheezing, rhonchi or rales.  Musculoskeletal:        General: Normal range of motion.     Right lower leg: No edema.     Left lower leg: No edema.  Skin:    General: Skin is warm and dry.     Findings: No rash.  Neurological:     General: No focal deficit present.     Mental Status: He is alert and oriented to person, place, and time.     Cranial Nerves: No cranial nerve deficit.  Psychiatric:        Mood and Affect: Mood normal.        Behavior: Behavior normal.        Thought Content: Thought content normal.        Judgment: Judgment normal.      Assessment and Plan   1. Acute pain of right knee - Ambulatory referral to Orthopedic Surgery  2. Type 2 diabetes mellitus without complication, without long-term current use of insulin (HCC) - blood glucose meter kit and supplies KIT; Dispense based on patient and insurance preference. Tests once daily as directed. (FOR ICD-10- E11.9)  Dispense: 1 each; Refill: 0 - metFORMIN (GLUCOPHAGE) 500 MG tablet; Take one tablet by mouth twice daily  Dispense: 180 tablet; Refill: 1  3. Generalized anxiety disorder - escitalopram (LEXAPRO) 10 MG tablet; Take 1 tablet (10 mg total) by mouth daily.  Dispense: 90 tablet; Refill: 1  4. Elevated blood pressure reading in office without diagnosis of hypertension    DM2- a1c improved. Cont meds.  Watch carb intake.  HLD- elevated, pt wanting to try diet modification, reviewed studies showing lower rsik for diabetics when taking statin and lisinopril.  Pt wanting to hold off until next visit and recheck labs. Elevated BP w/o HTN- slight elevation, cont to monitor and dec salt in diet. Anxiety- stable. cont with lexapro.   F/u 6 mo or prn.

## 2020-04-26 DIAGNOSIS — R030 Elevated blood-pressure reading, without diagnosis of hypertension: Secondary | ICD-10-CM | POA: Insufficient documentation

## 2020-05-03 ENCOUNTER — Encounter: Payer: Self-pay | Admitting: Family Medicine

## 2020-05-09 DIAGNOSIS — S83241A Other tear of medial meniscus, current injury, right knee, initial encounter: Secondary | ICD-10-CM | POA: Diagnosis not present

## 2020-05-28 ENCOUNTER — Emergency Department (HOSPITAL_COMMUNITY)
Admission: EM | Admit: 2020-05-28 | Discharge: 2020-05-28 | Disposition: A | Discharge status: Home / Self Care | Payer: BC Managed Care – PPO | Attending: Emergency Medicine | Admitting: Emergency Medicine

## 2020-05-28 ENCOUNTER — Other Ambulatory Visit: Payer: Self-pay

## 2020-05-28 ENCOUNTER — Encounter (HOSPITAL_COMMUNITY): Payer: Self-pay | Admitting: Emergency Medicine

## 2020-05-28 ENCOUNTER — Emergency Department (HOSPITAL_COMMUNITY): Payer: BC Managed Care – PPO

## 2020-05-28 DIAGNOSIS — M87851 Other osteonecrosis, right femur: Secondary | ICD-10-CM | POA: Diagnosis not present

## 2020-05-28 DIAGNOSIS — K219 Gastro-esophageal reflux disease without esophagitis: Secondary | ICD-10-CM | POA: Insufficient documentation

## 2020-05-28 DIAGNOSIS — N132 Hydronephrosis with renal and ureteral calculous obstruction: Secondary | ICD-10-CM | POA: Insufficient documentation

## 2020-05-28 DIAGNOSIS — R000 Tachycardia, unspecified: Secondary | ICD-10-CM | POA: Diagnosis not present

## 2020-05-28 DIAGNOSIS — N200 Calculus of kidney: Secondary | ICD-10-CM

## 2020-05-28 DIAGNOSIS — N1330 Unspecified hydronephrosis: Secondary | ICD-10-CM | POA: Diagnosis not present

## 2020-05-28 DIAGNOSIS — I7090 Unspecified atherosclerosis: Secondary | ICD-10-CM | POA: Diagnosis not present

## 2020-05-28 DIAGNOSIS — R1032 Left lower quadrant pain: Secondary | ICD-10-CM | POA: Diagnosis not present

## 2020-05-28 DIAGNOSIS — E119 Type 2 diabetes mellitus without complications: Secondary | ICD-10-CM | POA: Diagnosis not present

## 2020-05-28 DIAGNOSIS — Z7984 Long term (current) use of oral hypoglycemic drugs: Secondary | ICD-10-CM | POA: Diagnosis not present

## 2020-05-28 HISTORY — DX: Anxiety disorder, unspecified: F41.9

## 2020-05-28 LAB — COMPREHENSIVE METABOLIC PANEL
ALT: 48 U/L — ABNORMAL HIGH (ref 0–44)
AST: 42 U/L — ABNORMAL HIGH (ref 15–41)
Albumin: 4.2 g/dL (ref 3.5–5.0)
Alkaline Phosphatase: 72 U/L (ref 38–126)
Anion gap: 13 (ref 5–15)
BUN: 13 mg/dL (ref 6–20)
CO2: 25 mmol/L (ref 22–32)
Calcium: 9.8 mg/dL (ref 8.9–10.3)
Chloride: 100 mmol/L (ref 98–111)
Creatinine, Ser: 1.31 mg/dL — ABNORMAL HIGH (ref 0.61–1.24)
GFR calc Af Amer: >60 mL/min (ref >60)
GFR calc non Af Amer: >60 mL/min (ref >60)
Glucose, Bld: 203 mg/dL — ABNORMAL HIGH (ref 70–99)
Potassium: 3.3 mmol/L — ABNORMAL LOW (ref 3.5–5.1)
Sodium: 138 mmol/L (ref 135–145)
Total Bilirubin: 0.7 mg/dL (ref 0.3–1.2)
Total Protein: 7.9 g/dL (ref 6.5–8.1)

## 2020-05-28 LAB — URINALYSIS, ROUTINE W REFLEX MICROSCOPIC
Bacteria, UA: NONE SEEN
Bilirubin Urine: NEGATIVE
Glucose, UA: >=500 mg/dL — AB
Ketones, ur: 5 mg/dL — AB
Leukocytes,Ua: NEGATIVE
Nitrite: NEGATIVE
Protein, ur: 30 mg/dL — AB
RBC / HPF: >50 RBC/hpf — ABNORMAL HIGH (ref 0–5)
Specific Gravity, Urine: 1.028 (ref 1.005–1.030)
pH: 6 (ref 5.0–8.0)

## 2020-05-28 LAB — CBC WITH DIFFERENTIAL/PLATELET
Abs Immature Granulocytes: 0.04 10*3/uL (ref 0.00–0.07)
Basophils Absolute: 0 10*3/uL (ref 0.0–0.1)
Basophils Relative: 0 %
Eosinophils Absolute: 0 10*3/uL (ref 0.0–0.5)
Eosinophils Relative: 0 %
HCT: 46.3 % (ref 39.0–52.0)
Hemoglobin: 15.1 g/dL (ref 13.0–17.0)
Immature Granulocytes: 0 %
Lymphocytes Relative: 13 %
Lymphs Abs: 1.2 10*3/uL (ref 0.7–4.0)
MCH: 28.7 pg (ref 26.0–34.0)
MCHC: 32.6 g/dL (ref 30.0–36.0)
MCV: 87.9 fL (ref 80.0–100.0)
Monocytes Absolute: 0.7 10*3/uL (ref 0.1–1.0)
Monocytes Relative: 7 %
Neutro Abs: 7.2 10*3/uL (ref 1.7–7.7)
Neutrophils Relative %: 80 %
Platelets: 406 10*3/uL — ABNORMAL HIGH (ref 150–400)
RBC: 5.27 MIL/uL (ref 4.22–5.81)
RDW: 13.1 % (ref 11.5–15.5)
WBC: 9.2 10*3/uL (ref 4.0–10.5)
nRBC: 0 % (ref 0.0–0.2)

## 2020-05-28 MED ORDER — TAMSULOSIN HCL 0.4 MG PO CAPS: 0.4000 mg | ORAL_CAPSULE | Freq: Every day | ORAL | 0 refills | Status: DC

## 2020-05-28 MED ORDER — IBUPROFEN 600 MG PO TABS: 600.0000 mg | ORAL_TABLET | Freq: Four times a day (QID) | ORAL | 0 refills | Status: DC | PRN

## 2020-05-28 MED ORDER — HYDROMORPHONE HCL 1 MG/ML IJ SOLN
1.0000 mg | Freq: Once | INTRAMUSCULAR | Status: AC
  Administered 2020-05-28: 1 mg via INTRAVENOUS
  Filled 2020-05-28: qty 1

## 2020-05-28 MED ORDER — ONDANSETRON HCL 4 MG/2ML IJ SOLN
4.0000 mg | Freq: Once | INTRAMUSCULAR | Status: AC
  Administered 2020-05-28: 4 mg via INTRAVENOUS
  Filled 2020-05-28: qty 2

## 2020-05-28 MED ORDER — ONDANSETRON HCL 4 MG PO TABS: 4.0000 mg | ORAL_TABLET | Freq: Four times a day (QID) | ORAL | 0 refills | Status: DC

## 2020-05-28 MED ORDER — KETOROLAC TROMETHAMINE 30 MG/ML IJ SOLN
30.0000 mg | Freq: Once | INTRAMUSCULAR | Status: AC
  Administered 2020-05-28: 30 mg via INTRAVENOUS
  Filled 2020-05-28: qty 1

## 2020-05-28 NOTE — ED Triage Notes (Signed)
Reports hematuria yesterday that resolved today.  C/o severe L flank pain x 3 hours with nausea and vomiting.  Pt diaphoretic.  No history of kidney stones.

## 2020-05-28 NOTE — ED Provider Notes (Signed)
[facility] EMERGENCY DEPARTMENT Provider Note   CSN: [id number] Arrival date & time: 05/28/20  1116     History Chief Complaint  Patient presents with  . Flank Pain    Jerry Garcia is a 49 y.o. male.  The history is provided by the patient. No language interpreter was used.  Flank Pain     49 year old male significant for diabetes, anxiety, ulcerative colitis presenting for evaluation of flank pain.  Patient report yesterday he noticed blood in his urine but denies any urinary discomfort.  However for the past 3 hours he has been having acute onset of pain to his left flank radiates towards his left lower abdomen.  Pain is sharp stabbing very intense nothing seems to make it better or worse.  States he felt nauseous, vomiting, break out in sweats due to the pain.  He tried taking 2 Tylenol prior to arrival with minimal improvement.  He denies any prior history of kidney stone.  No chest pain or shortness of breath, no recent strenuous activities or heavy lifting.  He denies any testicle pain or scrotal pain.  Past Medical History:  Diagnosis Date  . Anxiety   . Chronic right hip pain   . Diabetes mellitus without complication ([place])   . Ulcerative colitis     Patient Active Problem List   Diagnosis Date Noted  . Elevated blood pressure reading in office without diagnosis of hypertension 04/26/2020  . Type 2 diabetes mellitus without complication, without long-term current use of insulin ([place]) 02/24/2018  . Insomnia 09/24/2014  . Generalized anxiety disorder 06/26/2014  . Fasciculations of muscle 06/14/2014  . GERD (gastroesophageal reflux disease) 06/14/2014  . Tachycardia 06/14/2014  . Impaired fasting glucose 02/01/2014  . Esophageal reflux 02/01/2014  . Right hip pain 05/05/2013  . HEMORRHOIDS-INTERNAL 10/25/2010  . HEMORRHOIDS-EXTERNAL 10/25/2010  . Ulcerative colitis ([place]) 10/25/2010  . RECTAL BLEEDING 10/25/2010  . PROCTITIS 10/25/2010     Past Surgical History:  Procedure Laterality Date  . COLONOSCOPY  04/30/2013  . HERNIA REPAIR  8676   umbilical  . INSERTION OF MESH  03/07/2020   Procedure: INSERTION OF MESH;  Surgeon: Coralie Keens, MD;  Location: [facility];  Service: General;;  . NASAL SINUS SURGERY    . UMBILICAL HERNIA REPAIR N/A 03/07/2020   Procedure: UMBILICAL HERNIA REPAIR WITH MESH;  Surgeon: Coralie Keens, MD;  Location: [facility];  Service: General;  Laterality: N/A;       Family History  Problem Relation Age of Onset  . Hypertension Mother   . Diabetes Mother   . Cancer Mother   . Alzheimer's disease Father   . Stomach cancer Paternal Uncle   . Colon cancer Neg Hx     Social History   Tobacco Use  . Smoking status: Never Smoker  . Smokeless tobacco: Never Used  Vaping Use  . Vaping Use: Never used  Substance Use Topics  . Alcohol use: No    Comment: occassionally   . Drug use: No    Home Medications Prior to Admission medications   Medication Sig Start Date End Date Taking? Authorizing Provider  ACCU-CHEK AVIVA PLUS test strip TEST ONCE DAILY 06/19/18   Mikey Kirschner, MD  acetaminophen (TYLENOL) 325 MG tablet Take 650 mg by mouth every 6 (six) hours as needed for moderate pain.    [provider]  blood glucose meter kit and supplies KIT Dispense based on patient and insurance preference. Tests  once daily as directed. (FOR ICD-10- E11.9) 04/25/20   Elvia Collum M, DO  escitalopram (LEXAPRO) 10 MG tablet Take 1 tablet (10 mg total) by mouth daily. 04/25/20   Erven Colla, DO  HYDROcodone-acetaminophen (NORCO) 5-325 MG tablet Take 1 tablet by mouth every 6 (six) hours as needed for moderate pain or severe pain. 03/07/20   Coralie Keens, MD  mesalamine (CANASA) 1000 MG suppository Place 1 suppository (1,000 mg total) rectally at bedtime. Patient taking differently: Place 1,000 mg rectally daily as needed (flare up).  05/20/16    Irene Shipper, MD  metFORMIN (GLUCOPHAGE) 500 MG tablet Take one tablet by mouth twice daily 04/25/20   Erven Colla, DO    Allergies    Oxycodone-acetaminophen  Review of Systems   Review of Systems  Genitourinary: Positive for flank pain.  All other systems reviewed and are negative.   Physical Exam Updated Vital Signs BP (!) 145/91   Pulse (!) 103   Temp 97.7 F (36.5 C) (Oral)   Resp (!) 24   SpO2 100%   Physical Exam Vitals and nursing note reviewed.  Constitutional:      General: He is in acute distress.     Appearance: He is well-developed.     Comments: Patient is sitting in the chair, appears uncomfortable, and diaphoretic.  HENT:     Head: Atraumatic.  Eyes:     Conjunctiva/sclera: Conjunctivae normal.  Cardiovascular:     Rate and Rhythm: Tachycardia present.     Pulses: Normal pulses.     Heart sounds: Normal heart sounds.  Pulmonary:     Effort: Pulmonary effort is normal. No respiratory distress.     Breath sounds: Normal breath sounds. No rhonchi or rales.  Abdominal:     Palpations: Abdomen is soft.     Tenderness: There is no abdominal tenderness. There is no right CVA tenderness or left CVA tenderness.  Musculoskeletal:     Cervical back: Neck supple.  Skin:    Findings: No rash.  Neurological:     Mental Status: He is alert. Mental status is at baseline.  Psychiatric:        Mood and Affect: Mood normal.     ED Results / Procedures / Treatments   Labs (all labs ordered are listed, but only abnormal results are displayed) Labs Reviewed  URINALYSIS, ROUTINE W REFLEX MICROSCOPIC - Abnormal; Notable for the following components:      Result Value   Glucose, UA >=500 (*)    Hgb urine dipstick LARGE (*)    Ketones, ur 5 (*)    Protein, ur 30 (*)    RBC / HPF >50 (*)    All other components within normal limits  CBC WITH DIFFERENTIAL/PLATELET - Abnormal; Notable for the following components:   Platelets 406 (*)    All other components  within normal limits  COMPREHENSIVE METABOLIC PANEL - Abnormal; Notable for the following components:   Potassium 3.3 (*)    Glucose, Bld 203 (*)    Creatinine, Ser 1.31 (*)    AST 42 (*)    ALT 48 (*)    All other components within normal limits    EKG None  Radiology CT Renal Stone Study  Result Date: 05/28/2020 CLINICAL DATA:  LEFT flank pain EXAM: CT ABDOMEN AND PELVIS WITHOUT CONTRAST TECHNIQUE: Multidetector CT imaging of the abdomen and pelvis was performed following the standard protocol without IV contrast. COMPARISON:  April 28, 2013 FINDINGS: Lower chest:  No acute abnormality. Hepatobiliary: Hepatic steatosis.  The gallbladder is unremarkable. Pancreas: No peripancreatic inflammation. Spleen: Normal in size without focal abnormality. Adrenals/Urinary Tract: Adrenal glands are unremarkable. There are multiple bilateral nonobstructing nephrolithiasis. There is minimal LEFT hydronephrosis. This is secondary to a 5 mm nephrolithiasis in the proximal LEFT ureter. Bladder is decompressed. Stomach/Bowel: Stomach is within normal limits. Appendix appears normal. No evidence of bowel wall thickening, distention, or inflammatory changes. Scattered diverticulosis. Vascular/Lymphatic: Circumaortic LEFT renal vein. Mild atherosclerotic calcifications. No suspicious lymph nodes. Reproductive: Prostate is unremarkable. Other: Fat containing LEFT inguinal hernia. Postsurgical changes along the umbilicus. No free fluid. Musculoskeletal: Curvilinear sclerosis of the RIGHT femoral head consistent with avascular necrosis. IMPRESSION: 1. Minimal LEFT hydronephrosis secondary to a 5 mm nephrolithiasis in the proximal LEFT ureter. 2. Hepatic steatosis. 3. Avascular necrosis of the RIGHT femoral head. Electronically Signed   By: Valentino Saxon MD   On: 05/28/2020 13:21    Procedures Procedures (including critical care time)  Medications Ordered in ED Medications  HYDROmorphone (DILAUDID) injection 1  mg (1 mg Intravenous Given 05/28/20 1249)  ondansetron (ZOFRAN) injection 4 mg (4 mg Intravenous Given 05/28/20 1249)  HYDROmorphone (DILAUDID) injection 1 mg (1 mg Intravenous Given 05/28/20 1439)  ketorolac (TORADOL) 30 MG/ML injection 30 mg (30 mg Intravenous Given 05/28/20 1521)    ED Course  I have reviewed the triage vital signs and the nursing notes.  Pertinent labs & imaging results that were available during my care of the patient were reviewed by me and considered in my medical decision making (see chart for details).    MDM Rules/Calculators/A&P                          BP (!) 134/94   Pulse 85   Temp 97.7 F (36.5 C) (Oral)   Resp (!) 24   SpO2 93%   Final Clinical Impression(s) / ED Diagnoses Final diagnoses:  Kidney stone on left side    Rx / DC Orders ED Discharge Orders         Ordered    ibuprofen (ADVIL) 600 MG tablet  Every 6 hours PRN        05/28/20 1553    ondansetron (ZOFRAN) 4 MG tablet  Every 6 hours        05/28/20 1553    tamsulosin (FLOMAX) 0.4 MG CAPS capsule  Daily        05/28/20 1553         12:40 PM Patient here with complaints of hematuria yesterday and now having left flank pain.  He appears uncomfortable, diaphoretic, and presents with symptoms suggestive of nephrolithiasis.  Will provide symptomatic treatment, check UA, labs, and obtain CT renal stone study for evaluation.  1:33 PM CT renal stone study demonstrate a minimal left hydronephrosis secondary to a 5 mm nephrolithiasis in the proximal left ureter.  This finding is consistent with patient presenting complaint.  Patient received initial 1 mg of Dilaudid with minimal improvement of his pain.  Will provide additional pain medication for better pain control.  3:56 PM At this time patient appears back pain mostly resolved after receiving Toradol.  He is stable for discharge with symptomatic treatment.  Outpatient follow-up with urology recommended.   Domenic Moras, PA-C 05/28/20  1557    Daleen Bo, MD 05/28/20 2024

## 2020-05-28 NOTE — Discharge Instructions (Signed)
You have been diagnosed with a 5 mm kidney stone on the left side.  Use urine strainer to capture the stone for further analysis.  Follow up with urologist for further care. any co

## 2020-05-31 DIAGNOSIS — N201 Calculus of ureter: Secondary | ICD-10-CM | POA: Diagnosis not present

## 2020-05-31 DIAGNOSIS — R1084 Generalized abdominal pain: Secondary | ICD-10-CM | POA: Diagnosis not present

## 2020-06-07 ENCOUNTER — Other Ambulatory Visit: Payer: Self-pay | Admitting: Urology

## 2020-06-08 ENCOUNTER — Other Ambulatory Visit (HOSPITAL_COMMUNITY)
Admission: RE | Admit: 2020-06-08 | Discharge: 2020-06-08 | Disposition: A | Discharge status: Home / Self Care | Payer: BC Managed Care – PPO | Source: Ambulatory Visit | Attending: Urology | Admitting: Urology

## 2020-06-08 ENCOUNTER — Encounter (HOSPITAL_BASED_OUTPATIENT_CLINIC_OR_DEPARTMENT_OTHER): Payer: Self-pay | Admitting: Urology

## 2020-06-08 ENCOUNTER — Other Ambulatory Visit: Payer: Self-pay

## 2020-06-08 DIAGNOSIS — Z20822 Contact with and (suspected) exposure to covid-19: Secondary | ICD-10-CM | POA: Insufficient documentation

## 2020-06-08 DIAGNOSIS — Z01812 Encounter for preprocedural laboratory examination: Secondary | ICD-10-CM | POA: Insufficient documentation

## 2020-06-08 LAB — SARS CORONAVIRUS 2 (TAT 6-24 HRS): SARS Coronavirus 2: NEGATIVE

## 2020-06-08 NOTE — Progress Notes (Signed)
Spoke w/ via phone for pre-op interview---pt Lab needs dos----   I stat 8            Lab results------ekg 03-03-2020 epic COVID test ------06-08-2020 Arrive at -------1130 am 06-12-2020 NPO after MN NO Solid Food.  Clear liquids from MN until---1030 am then npo Medications to take morning of surgery -----escitalopram, tamsulodin Diabetic medication -----none day of surgery Patient Special Instructions -----none Pre-Op special Istructions -----none Patient verbalized understanding of instructions that were given at this phone interview. Patient denies shortness of breath, chest pain, fever, cough at this phone interview.

## 2020-06-11 ENCOUNTER — Encounter (HOSPITAL_COMMUNITY): Payer: Self-pay | Admitting: Anesthesiology

## 2020-06-11 NOTE — Anesthesia Preprocedure Evaluation (Deleted)
Anesthesia Evaluation    Reviewed: Allergy & Precautions, Patient's Chart, lab work & pertinent test results  Airway        Dental   Pulmonary neg pulmonary ROS,           Cardiovascular Exercise Tolerance: Good negative cardio ROS    25 June 21 EKG SR R 93   Neuro/Psych Anxiety negative neurological ROS     GI/Hepatic Neg liver ROS, PUD, GERD  Medicated,  Endo/Other  diabetes, Well Controlled, Type 2  Renal/GU Renal diseaseNephrolithiasis     Musculoskeletal negative musculoskeletal ROS (+)   Abdominal (+) + obese,   Peds negative pediatric ROS (+)  Hematology negative hematology ROS (+)   Anesthesia Other Findings   Reproductive/Obstetrics                            Anesthesia Physical Anesthesia Plan  ASA: III  Anesthesia Plan: General   Post-op Pain Management:    Induction: Intravenous  PONV Risk Score and Plan: 3 and Ondansetron, Dexamethasone and Treatment may vary due to age or medical condition  Airway Management Planned: LMA  Additional Equipment: None  Intra-op Plan:   Post-operative Plan:   Informed Consent:     Dental advisory given  Plan Discussed with: CRNA  Anesthesia Plan Comments:         Anesthesia Quick Evaluation

## 2020-06-12 ENCOUNTER — Ambulatory Visit (HOSPITAL_BASED_OUTPATIENT_CLINIC_OR_DEPARTMENT_OTHER): Admission: RE | Admit: 2020-06-12 | Payer: BC Managed Care – PPO | Source: Home / Self Care | Admitting: Urology

## 2020-06-12 DIAGNOSIS — N201 Calculus of ureter: Secondary | ICD-10-CM | POA: Diagnosis not present

## 2020-06-12 HISTORY — DX: Gastro-esophageal reflux disease without esophagitis: K21.9

## 2020-06-12 HISTORY — DX: Type 2 diabetes mellitus without complications: E11.9

## 2020-06-12 HISTORY — DX: Personal history of urinary calculi: Z87.442

## 2020-06-12 SURGERY — CYSTOSCOPY/URETEROSCOPY/HOLMIUM LASER/STENT PLACEMENT
Anesthesia: General | Laterality: Left

## 2020-07-13 DIAGNOSIS — Z87442 Personal history of urinary calculi: Secondary | ICD-10-CM | POA: Diagnosis not present

## 2020-07-19 ENCOUNTER — Other Ambulatory Visit: Payer: Self-pay | Admitting: Family Medicine

## 2020-07-20 ENCOUNTER — Other Ambulatory Visit: Payer: BC Managed Care – PPO

## 2020-07-27 ENCOUNTER — Other Ambulatory Visit: Payer: Self-pay

## 2020-07-27 ENCOUNTER — Other Ambulatory Visit (INDEPENDENT_AMBULATORY_CARE_PROVIDER_SITE_OTHER): Payer: BC Managed Care – PPO | Admitting: *Deleted

## 2020-07-27 DIAGNOSIS — Z23 Encounter for immunization: Secondary | ICD-10-CM

## 2020-10-26 ENCOUNTER — Ambulatory Visit: Payer: BC Managed Care – PPO | Admitting: Family Medicine

## 2021-01-01 ENCOUNTER — Ambulatory Visit (INDEPENDENT_AMBULATORY_CARE_PROVIDER_SITE_OTHER): Payer: BC Managed Care – PPO | Admitting: Family Medicine

## 2021-01-01 ENCOUNTER — Other Ambulatory Visit: Payer: Self-pay

## 2021-01-01 VITALS — BP 120/80 | HR 111 | Temp 97.2°F | Ht 70.0 in | Wt 217.0 lb

## 2021-01-01 DIAGNOSIS — Z125 Encounter for screening for malignant neoplasm of prostate: Secondary | ICD-10-CM

## 2021-01-01 DIAGNOSIS — E119 Type 2 diabetes mellitus without complications: Secondary | ICD-10-CM | POA: Diagnosis not present

## 2021-01-01 DIAGNOSIS — F411 Generalized anxiety disorder: Secondary | ICD-10-CM | POA: Diagnosis not present

## 2021-01-01 DIAGNOSIS — I872 Venous insufficiency (chronic) (peripheral): Secondary | ICD-10-CM | POA: Diagnosis not present

## 2021-01-01 MED ORDER — METFORMIN HCL 500 MG PO TABS: ORAL_TABLET | ORAL | 1 refills | Status: DC

## 2021-01-01 MED ORDER — ESCITALOPRAM OXALATE 10 MG PO TABS: 10.0000 mg | ORAL_TABLET | Freq: Every day | ORAL | 1 refills | Status: DC

## 2021-01-01 NOTE — Progress Notes (Signed)
Patient ID: Jerry Garcia, male    DOB: 10-Aug-1971, 50 y.o.   MRN: [id number]   Chief Complaint  Patient presents with  . Diabetes    Redness in lower legs    Subjective:    HPI  H/o anxiety- Doing well on lexapro.  Not having any new concerns.   Dm2- Compliant with medications. Checking blood glucose.   Not seeing any high or low numbers.  Denies polyuria or polydipsia.  Eye exam: overdue. Foot exam: no foot concerns.  Hasn't had labs yet.   Medical History Jerry Garcia has a past medical history of Anxiety, DM type 2 (diabetes mellitus, type 2) ([place]), GERD (gastroesophageal reflux disease), History of kidney stones, and Ulcerative colitis (last flare  may  2021).   Outpatient Encounter Medications as of 01/01/2021  Medication Sig  . ACCU-CHEK GUIDE test strip TEST ONCE DAILY AS DIRECTED  . blood glucose meter kit and supplies KIT Dispense based on patient and insurance preference. Tests once daily as directed. (FOR ICD-10- E11.9)  . escitalopram (LEXAPRO) 10 MG tablet Take 1 tablet (10 mg total) by mouth daily.  . metFORMIN (GLUCOPHAGE) 500 MG tablet Take one tablet by mouth twice daily  . [DISCONTINUED] acetaminophen (TYLENOL) 500 MG tablet Take 1,000 mg by mouth every 6 (six) hours as needed.  . [DISCONTINUED] escitalopram (LEXAPRO) 10 MG tablet Take 1 tablet (10 mg total) by mouth daily.  . [DISCONTINUED] HYDROcodone-acetaminophen (NORCO) 5-325 MG tablet Take 1 tablet by mouth every 6 (six) hours as needed for moderate pain or severe pain. (Patient not taking: Reported on 05/28/2020)  . [DISCONTINUED] ibuprofen (ADVIL) 600 MG tablet Take 1 tablet (600 mg total) by mouth every 6 (six) hours as needed.  . [DISCONTINUED] metFORMIN (GLUCOPHAGE) 500 MG tablet Take one tablet by mouth twice daily  . [DISCONTINUED] ondansetron (ZOFRAN) 4 MG tablet Take 1 tablet (4 mg total) by mouth every 6 (six) hours.  . [DISCONTINUED] tamsulosin (FLOMAX) 0.4 MG CAPS capsule Take 1 capsule (0.4  mg total) by mouth daily.   No facility-administered encounter medications on file as of 01/01/2021.     Review of Systems  Constitutional: Negative for chills and fever.  HENT: Negative for congestion, rhinorrhea and sore throat.   Respiratory: Negative for cough, shortness of breath and wheezing.   Cardiovascular: Negative for chest pain and leg swelling.  Gastrointestinal: Negative for abdominal pain, diarrhea, nausea and vomiting.  Genitourinary: Negative for dysuria and frequency.  Skin: Negative for rash.  Neurological: Negative for dizziness, weakness and headaches.  Psychiatric/Behavioral: Negative for dysphoric mood. The patient is not nervous/anxious.      Vitals BP 120/80   Pulse (!) 111   Temp (!) 97.2 F (36.2 C)   Ht 5' 10"  (1.778 m)   Wt 217 lb (98.4 kg)   SpO2 97%   BMI 31.14 kg/m   Objective:   Physical Exam Vitals and nursing note reviewed.  Constitutional:      General: He is not in acute distress.    Appearance: Normal appearance. He is not ill-appearing.  HENT:     Head: Normocephalic.     Nose: Nose normal. No congestion.     Mouth/Throat:     Mouth: Mucous membranes are moist.     Pharynx: No oropharyngeal exudate.  Eyes:     Extraocular Movements: Extraocular movements intact.     Conjunctiva/sclera: Conjunctivae normal.     Pupils: Pupils are equal, round, and reactive to light.  Cardiovascular:  Rate and Rhythm: Regular rhythm. Tachycardia present.     Pulses: Normal pulses.     Heart sounds: Normal heart sounds. No murmur heard.   Pulmonary:     Effort: Pulmonary effort is normal.     Breath sounds: Normal breath sounds. No wheezing, rhonchi or rales.  Musculoskeletal:        General: Normal range of motion.     Right lower leg: No edema.     Left lower leg: No edema.     Comments: +darkening of skin of lower legs bilaterally. No erythema,swelling or edema.  Skin:    General: Skin is warm and dry.     Findings: No rash.   Neurological:     General: No focal deficit present.     Mental Status: He is alert and oriented to person, place, and time.     Cranial Nerves: No cranial nerve deficit.  Psychiatric:        Mood and Affect: Mood normal.        Behavior: Behavior normal.        Thought Content: Thought content normal.        Judgment: Judgment normal.      Assessment and Plan   1. Type 2 diabetes mellitus without complication, without long-term current use of insulin (HCC) - CBC - CMP14+EGFR - Hemoglobin A1c - Lipid panel - Microalbumin, urine - metFORMIN (GLUCOPHAGE) 500 MG tablet; Take one tablet by mouth twice daily  Dispense: 180 tablet; Refill: 1  2. Screening PSA (prostate specific antigen) - PSA; Future  3. Venous stasis dermatitis of both lower extremities  4. Generalized anxiety disorder - escitalopram (LEXAPRO) 10 MG tablet; Take 1 tablet (10 mg total) by mouth daily.  Dispense: 90 tablet; Refill: 1   DM2- will recheck labs, cont meds for now.  Venous stasis dermatitis on LEs- stable, mild. Cont to monitor.  GAD- stable. Cont meds.    Return in about 6 months (around 07/03/2021) for f/u dm2.

## 2021-01-02 LAB — LIPID PANEL
Chol/HDL Ratio: 3.5 ratio (ref 0.0–5.0)
Cholesterol, Total: 240 mg/dL — ABNORMAL HIGH (ref 100–199)
HDL: 69 mg/dL (ref >39)
LDL Chol Calc (NIH): 152 mg/dL — ABNORMAL HIGH (ref 0–99)
Triglycerides: 109 mg/dL (ref 0–149)
VLDL Cholesterol Cal: 19 mg/dL (ref 5–40)

## 2021-01-02 LAB — CBC
Hematocrit: 47 % (ref 37.5–51.0)
Hemoglobin: 15.9 g/dL (ref 13.0–17.7)
MCH: 29.1 pg (ref 26.6–33.0)
MCHC: 33.8 g/dL (ref 31.5–35.7)
MCV: 86 fL (ref 79–97)
Platelets: 416 10*3/uL (ref 150–450)
RBC: 5.47 x10E6/uL (ref 4.14–5.80)
RDW: 12.5 % (ref 11.6–15.4)
WBC: 7.4 10*3/uL (ref 3.4–10.8)

## 2021-01-02 LAB — CMP14+EGFR
ALT: 31 IU/L (ref 0–44)
AST: 24 IU/L (ref 0–40)
Albumin/Globulin Ratio: 1.6 (ref 1.2–2.2)
Albumin: 4.9 g/dL (ref 3.8–4.9)
Alkaline Phosphatase: 101 IU/L (ref 44–121)
BUN/Creatinine Ratio: 21 — ABNORMAL HIGH (ref 9–20)
BUN: 22 mg/dL (ref 6–24)
Bilirubin Total: 0.4 mg/dL (ref 0.0–1.2)
CO2: 19 mmol/L — ABNORMAL LOW (ref 20–29)
Calcium: 9.6 mg/dL (ref 8.7–10.2)
Chloride: 100 mmol/L (ref 96–106)
Creatinine, Ser: 1.03 mg/dL (ref 0.76–1.27)
Globulin, Total: 3 g/dL (ref 1.5–4.5)
Glucose: 141 mg/dL — ABNORMAL HIGH (ref 65–99)
Potassium: 4.7 mmol/L (ref 3.5–5.2)
Sodium: 140 mmol/L (ref 134–144)
Total Protein: 7.9 g/dL (ref 6.0–8.5)
eGFR: 87 mL/min/{1.73_m2} (ref >59)

## 2021-01-02 LAB — HEMOGLOBIN A1C
Est. average glucose Bld gHb Est-mCnc: 180 mg/dL
Hgb A1c MFr Bld: 7.9 % — ABNORMAL HIGH (ref 4.8–5.6)

## 2021-01-02 LAB — MICROALBUMIN, URINE: Microalbumin, Urine: 22.4 ug/mL

## 2021-01-03 ENCOUNTER — Encounter: Payer: Self-pay | Admitting: Family Medicine

## 2021-01-03 ENCOUNTER — Telehealth: Payer: Self-pay | Admitting: Family Medicine

## 2021-01-03 MED ORDER — METFORMIN HCL 500 MG PO TABS: 750.0000 mg | ORAL_TABLET | Freq: Two times a day (BID) | ORAL | 1 refills | Status: DC

## 2021-01-03 NOTE — Telephone Encounter (Signed)
Patient notified and verbalized understanding. 

## 2021-08-20 ENCOUNTER — Telehealth: Payer: Self-pay | Admitting: Family Medicine

## 2021-08-20 DIAGNOSIS — F411 Generalized anxiety disorder: Secondary | ICD-10-CM

## 2021-08-21 NOTE — Telephone Encounter (Signed)
Please contact patient to have him set up appt. Thank you. (Then may route back to nurses. )

## 2021-08-24 NOTE — Telephone Encounter (Signed)
Patient is scheduled for Monday.  CB# [phone]

## 2021-08-27 ENCOUNTER — Other Ambulatory Visit: Payer: Self-pay

## 2021-08-27 ENCOUNTER — Ambulatory Visit (INDEPENDENT_AMBULATORY_CARE_PROVIDER_SITE_OTHER): Payer: BC Managed Care – PPO | Admitting: Family Medicine

## 2021-08-27 VITALS — BP 125/80 | HR 114 | Temp 98.0°F | Ht 70.0 in | Wt 219.0 lb

## 2021-08-27 DIAGNOSIS — E119 Type 2 diabetes mellitus without complications: Secondary | ICD-10-CM

## 2021-08-27 DIAGNOSIS — E785 Hyperlipidemia, unspecified: Secondary | ICD-10-CM | POA: Insufficient documentation

## 2021-08-27 NOTE — Progress Notes (Signed)
Subjective:  Patient ID: Jerry Garcia, male    DOB: 03-12-1971  Age: 50 y.o. MRN: [id number]  CC: Chief Complaint  Patient presents with   TYPE 2 Diabetes     Follow up     HPI:  50 year old male with type 2 diabetes, ulcerative colitis/proctitis, anxiety, hyperlipidemia presents for follow-up.  DM-2 Unsure of control.  Last A1c was 7.9.  Patient is only taking metformin 500 mg daily.  He states that his fasting blood sugars are checked intermittently.  Range from 110-125. Needs A1c.  Hyperlipidemia Uncontrolled/not at goal. Patient is not on pharmacotherapy. Will discuss today.  Patient Active Problem List   Diagnosis Date Noted   Hyperlipidemia 08/27/2021   Type 2 diabetes mellitus without complication, without long-term current use of insulin ([place]) 02/24/2018   Generalized anxiety disorder 06/26/2014   Ulcerative colitis ([place]) 10/25/2010    Social Hx   Social History   Socioeconomic History   Marital status: Single    Spouse name: Not on file   Number of children: 0   Years of education: Not on file   Highest education level: Not on file  Occupational History   Occupation: Control and instrumentation engineer  Tobacco Use   Smoking status: Never   Smokeless tobacco: Never  Vaping Use   Vaping Use: Never used  Substance and Sexual Activity   Alcohol use: Yes    Comment: social   Drug use: No   Sexual activity: Not on file  Other Topics Concern   Not on file  Social History Narrative   Patient lives at home with mother Ashaz Robling.   Patient is right handed    Patient is single   Patient has no children    Social Determinants of Health   Financial Resource Strain: Not on file  Food Insecurity: Not on file  Transportation Needs: Not on file  Physical Activity: Not on file  Stress: Not on file  Social Connections: Not on file    Review of Systems  Respiratory: Negative.    Cardiovascular: Negative.     Objective:  BP 125/80    Pulse (!) 114    Temp 98 F (36.7  C)    Ht 5' 10"  (1.778 m)    Wt 219 lb (99.3 kg)    SpO2 98%    BMI 31.42 kg/m   BP/Weight 08/27/2021 01/01/2021 90/10/4095  Systolic BP 353 299 -  Diastolic BP 80 80 -  Wt. (Lbs) 219 217 -  BMI 31.42 31.14 31.57    Physical Exam Vitals and nursing note reviewed.  Constitutional:      General: He is not in acute distress.    Appearance: Normal appearance. He is not ill-appearing.  HENT:     Head: Normocephalic and atraumatic.  Cardiovascular:     Rate and Rhythm: Normal rate and regular rhythm.  Pulmonary:     Effort: Pulmonary effort is normal.     Breath sounds: Normal breath sounds. No wheezing, rhonchi or rales.  Neurological:     Mental Status: He is alert.  Psychiatric:        Mood and Affect: Mood normal.        Behavior: Behavior normal.    Lab Results  Component Value Date   WBC 7.4 01/01/2021   HGB 15.9 01/01/2021   HCT 47.0 01/01/2021   PLT 416 01/01/2021   GLUCOSE 141 (H) 01/01/2021   CHOL 240 (H) 01/01/2021   TRIG 109 01/01/2021   HDL 69  01/01/2021   LDLCALC 152 (H) 01/01/2021   ALT 31 01/01/2021   AST 24 01/01/2021   NA 140 01/01/2021   K 4.7 01/01/2021   CL 100 01/01/2021   CREATININE 1.03 01/01/2021   BUN 22 01/01/2021   CO2 19 (L) 01/01/2021   TSH 2.690 06/14/2014   HGBA1C 7.9 (H) 01/01/2021     Assessment & Plan:   Problem List Items Addressed This Visit       Endocrine   Type 2 diabetes mellitus without complication, without long-term current use of insulin ([place]) - Primary    I suspect that his diabetes is not well controlled.  A1c ordered.  Awaiting results.  We will continue metformin at current dosing for now.        Relevant Orders   CMP14+EGFR   Hemoglobin A1c     Other   Hyperlipidemia    Uncontrolled.  Discussed need for lipid panel as well as likely lipid-lowering therapy.  Awaiting test results.      Relevant Orders   Lipid panel     Follow-up:  Return in about 3 months (around 11/25/2021).  [place]

## 2021-08-27 NOTE — Assessment & Plan Note (Signed)
Uncontrolled.  Discussed need for lipid panel as well as likely lipid-lowering therapy.  Awaiting test results.

## 2021-08-27 NOTE — Assessment & Plan Note (Signed)
I suspect that his diabetes is not well controlled.  A1c ordered.  Awaiting results.  We will continue metformin at current dosing for now.

## 2021-08-27 NOTE — Patient Instructions (Signed)
Fasting labs.  Follow up in 3 months.  Take care  Dr. Lacinda Axon

## 2021-08-28 NOTE — Telephone Encounter (Signed)
Had appointment on 08/27/21

## 2021-11-25 ENCOUNTER — Other Ambulatory Visit: Payer: Self-pay | Admitting: Family Medicine

## 2021-11-25 DIAGNOSIS — E119 Type 2 diabetes mellitus without complications: Secondary | ICD-10-CM

## 2021-11-26 ENCOUNTER — Ambulatory Visit: Payer: BC Managed Care – PPO | Admitting: Family Medicine

## 2022-02-21 ENCOUNTER — Other Ambulatory Visit: Payer: Self-pay | Admitting: Family Medicine

## 2022-02-21 DIAGNOSIS — F411 Generalized anxiety disorder: Secondary | ICD-10-CM

## 2022-02-22 ENCOUNTER — Other Ambulatory Visit: Payer: Self-pay

## 2022-02-22 ENCOUNTER — Other Ambulatory Visit: Payer: Self-pay | Admitting: Family Medicine

## 2022-02-22 DIAGNOSIS — E119 Type 2 diabetes mellitus without complications: Secondary | ICD-10-CM

## 2022-02-22 MED ORDER — METFORMIN HCL 500 MG PO TABS: 500.0000 mg | ORAL_TABLET | Freq: Two times a day (BID) | ORAL | 0 refills | Status: DC

## 2022-03-06 ENCOUNTER — Other Ambulatory Visit: Payer: Self-pay | Admitting: Family Medicine

## 2022-03-06 DIAGNOSIS — F411 Generalized anxiety disorder: Secondary | ICD-10-CM

## 2022-06-28 ENCOUNTER — Telehealth: Payer: Self-pay | Admitting: Family Medicine

## 2022-06-28 NOTE — Telephone Encounter (Signed)
Patient has physical on 10/26 and needs labs done.

## 2022-06-28 NOTE — Telephone Encounter (Signed)
Pt has active orders in from 08/27/21- Lipid, A1C and CMP14+EGFR. (Pt is Dr.Cook but is seeing you for physical). Please advise. Thank you

## 2022-07-01 ENCOUNTER — Other Ambulatory Visit: Payer: Self-pay | Admitting: Nurse Practitioner

## 2022-07-01 DIAGNOSIS — E119 Type 2 diabetes mellitus without complications: Secondary | ICD-10-CM

## 2022-07-01 DIAGNOSIS — E785 Hyperlipidemia, unspecified: Secondary | ICD-10-CM

## 2022-07-01 DIAGNOSIS — Z125 Encounter for screening for malignant neoplasm of prostate: Secondary | ICD-10-CM

## 2022-07-01 NOTE — Telephone Encounter (Signed)
Labs ordered.

## 2022-07-03 NOTE — Telephone Encounter (Signed)
Patient notified

## 2022-07-03 NOTE — Telephone Encounter (Signed)
Left message to return call 

## 2022-07-04 ENCOUNTER — Ambulatory Visit (INDEPENDENT_AMBULATORY_CARE_PROVIDER_SITE_OTHER): Payer: BC Managed Care – PPO | Admitting: Nurse Practitioner

## 2022-07-04 VITALS — BP 130/90 | HR 93 | Temp 98.1°F | Ht 70.0 in | Wt 210.0 lb

## 2022-07-04 DIAGNOSIS — E785 Hyperlipidemia, unspecified: Secondary | ICD-10-CM

## 2022-07-04 DIAGNOSIS — E119 Type 2 diabetes mellitus without complications: Secondary | ICD-10-CM | POA: Diagnosis not present

## 2022-07-04 DIAGNOSIS — Z0000 Encounter for general adult medical examination without abnormal findings: Secondary | ICD-10-CM

## 2022-07-04 DIAGNOSIS — Z125 Encounter for screening for malignant neoplasm of prostate: Secondary | ICD-10-CM

## 2022-07-04 NOTE — Patient Instructions (Signed)
Pleasure seeing for your physical!  See you next year or sooner if needed.  Barbee Shropshire

## 2022-07-04 NOTE — Progress Notes (Signed)
   Subjective:    Patient ID: Jerry Garcia, male    DOB: 01/20/1971, 51 y.o.   MRN: [id number]  HPI  The patient comes in today for a wellness visit.    A review of their health history was completed.  A review of medications was also completed.  Any needed refills; no  Eating habits: good  Falls/  MVA accidents in past few months: no  Regular exercise: no walking at work  Specialist pt sees on regular basis: no  Preventative health issues were discussed.   Additional concerns: no   Review of Systems  All other systems reviewed and are negative.      Objective:   Physical Exam Vitals reviewed.  Constitutional:      General: He is not in acute distress.    Appearance: Normal appearance. He is normal weight. He is not ill-appearing, toxic-appearing or diaphoretic.  HENT:     Head: Normocephalic and atraumatic.  Cardiovascular:     Rate and Rhythm: Normal rate and regular rhythm.     Pulses: Normal pulses.     Heart sounds: Normal heart sounds. No murmur heard. Pulmonary:     Effort: Pulmonary effort is normal. No respiratory distress.     Breath sounds: Normal breath sounds. No wheezing.  Musculoskeletal:     Cervical back: Normal range of motion and neck supple. No rigidity or tenderness.     Comments: Grossly intact  Lymphadenopathy:     Cervical: No cervical adenopathy.  Skin:    General: Skin is warm.     Capillary Refill: Capillary refill takes less than 2 seconds.  Neurological:     Mental Status: He is alert.     Comments: Grossly intact  Psychiatric:        Mood and Affect: Mood normal.        Behavior: Behavior normal.           Assessment & Plan:   1.  Wellness exam Adult wellness-complete.wellness physical was conducted today. Importance of diet and exercise were discussed in detail.  Importance of stress reduction and healthy living were discussed.  In addition to this a discussion regarding safety was also covered.  We also  reviewed over immunizations and gave recommendations regarding current immunization needed for age.   In addition to this additional areas were also touched on including: Preventative health exams needed:  Colonoscopy up-to-date -Declined HIV and hepatitis C screening -Foot exam completed today -Patient encouraged to get eye exam Tdap vaccine, and Shingrix vaccine  Patient was advised yearly wellness exam  2. Type 2 diabetes mellitus without complication, unspecified whether long term insulin use (HCC) -Patient not currently checking blood sugars -Last A1c 7.9 -Patient continues to use metformin without difficulty -Patient also continue with lifestyle changes (increased exercise and diet modifications) -A1c pending today -Follow-up in  6 months to 1 year with PCP  3. Hyperlipidemia, unspecified hyperlipidemia type -Last LDL 152 -Repeat lipid test today -Patient not currently on any statin therapy  4.  PSA screening -PSA  Return to clinic to follow-up with PCP

## 2022-07-06 LAB — LIPID PANEL
Chol/HDL Ratio: 3.6 ratio (ref 0.0–5.0)
Cholesterol, Total: 251 mg/dL — ABNORMAL HIGH (ref 100–199)
HDL: 70 mg/dL (ref >39)
LDL Chol Calc (NIH): 167 mg/dL — ABNORMAL HIGH (ref 0–99)
Triglycerides: 83 mg/dL (ref 0–149)
VLDL Cholesterol Cal: 14 mg/dL (ref 5–40)

## 2022-07-06 LAB — CMP14+EGFR
ALT: 27 IU/L (ref 0–44)
AST: 25 IU/L (ref 0–40)
Albumin/Globulin Ratio: 1.8 (ref 1.2–2.2)
Albumin: 4.8 g/dL (ref 3.8–4.9)
Alkaline Phosphatase: 92 IU/L (ref 44–121)
BUN/Creatinine Ratio: 19 (ref 9–20)
BUN: 20 mg/dL (ref 6–24)
Bilirubin Total: 0.6 mg/dL (ref 0.0–1.2)
CO2: 23 mmol/L (ref 20–29)
Calcium: 10 mg/dL (ref 8.7–10.2)
Chloride: 102 mmol/L (ref 96–106)
Creatinine, Ser: 1.07 mg/dL (ref 0.76–1.27)
Globulin, Total: 2.7 g/dL (ref 1.5–4.5)
Glucose: 105 mg/dL — ABNORMAL HIGH (ref 70–99)
Potassium: 5.1 mmol/L (ref 3.5–5.2)
Sodium: 144 mmol/L (ref 134–144)
Total Protein: 7.5 g/dL (ref 6.0–8.5)
eGFR: 83 mL/min/{1.73_m2} (ref >59)

## 2022-07-06 LAB — MICROALBUMIN / CREATININE URINE RATIO
Creatinine, Urine: 233.5 mg/dL
Microalb/Creat Ratio: 7 mg/g creat (ref 0–29)
Microalbumin, Urine: 16.7 ug/mL

## 2022-07-06 LAB — HEMOGLOBIN A1C
Est. average glucose Bld gHb Est-mCnc: 160 mg/dL
Hgb A1c MFr Bld: 7.2 % — ABNORMAL HIGH (ref 4.8–5.6)

## 2022-07-06 LAB — PSA: Prostate Specific Ag, Serum: 0.3 ng/mL (ref 0.0–4.0)

## 2022-07-08 ENCOUNTER — Other Ambulatory Visit: Payer: Self-pay | Admitting: Nurse Practitioner

## 2022-07-08 ENCOUNTER — Encounter: Payer: Self-pay | Admitting: Nurse Practitioner

## 2022-07-08 DIAGNOSIS — E119 Type 2 diabetes mellitus without complications: Secondary | ICD-10-CM

## 2022-07-08 MED ORDER — METFORMIN HCL 500 MG PO TABS: 1000.0000 mg | ORAL_TABLET | Freq: Two times a day (BID) | ORAL | 1 refills | Status: DC

## 2022-07-08 MED ORDER — ROSUVASTATIN CALCIUM 10 MG PO TABS: 10.0000 mg | ORAL_TABLET | Freq: Every day | ORAL | 3 refills | Status: DC

## 2022-07-08 NOTE — Progress Notes (Signed)
Please call patient with following message:  Your cholesterol remains high.  It is recommended that you start on a cholesterol-lowering medication called a statin. (10 year ASCVD score 8.7% high intensity statin recommended). I will start you at 20m of Crestor and likely work you up to 233mas needed. Medication already sent in.   Your A1c is not quite at goal. Your goal is less than 7.0. I have increased your metformin to 100055mID. In order to prevent GI upset, slowly increase your metformin by taking 1000m48m the morning and 500mg17mthe evening x1-2 weeks and then start taking 1000mg 37m  All you other lab work looked good.  Follow up in 3 months with Dr. Cook. Lacinda AxonnnaBarbee Shropshire

## 2022-09-04 ENCOUNTER — Ambulatory Visit: Payer: BC Managed Care – PPO | Admitting: Internal Medicine

## 2022-10-14 ENCOUNTER — Other Ambulatory Visit: Payer: Self-pay | Admitting: *Deleted

## 2022-10-14 DIAGNOSIS — E119 Type 2 diabetes mellitus without complications: Secondary | ICD-10-CM

## 2022-10-14 MED ORDER — METFORMIN HCL 500 MG PO TABS: 1000.0000 mg | ORAL_TABLET | Freq: Two times a day (BID) | ORAL | 0 refills | Status: DC

## 2022-11-26 ENCOUNTER — Other Ambulatory Visit: Payer: Self-pay | Admitting: Family Medicine

## 2022-11-26 DIAGNOSIS — F411 Generalized anxiety disorder: Secondary | ICD-10-CM

## 2023-04-20 ENCOUNTER — Other Ambulatory Visit: Payer: Self-pay | Admitting: Family Medicine

## 2023-04-20 DIAGNOSIS — F411 Generalized anxiety disorder: Secondary | ICD-10-CM

## 2023-07-10 DIAGNOSIS — Z0000 Encounter for general adult medical examination without abnormal findings: Secondary | ICD-10-CM | POA: Diagnosis not present

## 2023-07-18 ENCOUNTER — Other Ambulatory Visit: Payer: Self-pay | Admitting: Family Medicine

## 2023-07-18 DIAGNOSIS — F411 Generalized anxiety disorder: Secondary | ICD-10-CM

## 2023-08-01 ENCOUNTER — Encounter: Payer: BC Managed Care – PPO | Admitting: Family Medicine

## 2023-08-28 ENCOUNTER — Other Ambulatory Visit: Payer: Self-pay

## 2023-08-28 DIAGNOSIS — E119 Type 2 diabetes mellitus without complications: Secondary | ICD-10-CM

## 2023-08-28 MED ORDER — METFORMIN HCL 500 MG PO TABS: 1000.0000 mg | ORAL_TABLET | Freq: Two times a day (BID) | ORAL | 0 refills | Status: DC

## 2023-09-19 ENCOUNTER — Other Ambulatory Visit: Payer: Self-pay | Admitting: Family Medicine

## 2023-09-19 DIAGNOSIS — E119 Type 2 diabetes mellitus without complications: Secondary | ICD-10-CM

## 2023-10-09 ENCOUNTER — Other Ambulatory Visit: Payer: Self-pay | Admitting: Family Medicine

## 2023-10-09 DIAGNOSIS — E119 Type 2 diabetes mellitus without complications: Secondary | ICD-10-CM

## 2023-10-09 NOTE — Telephone Encounter (Signed)
Copied from CRM [id number]. Topic: Clinical - Medication Refill >> Oct 09, 2023  8:45 AM Jorje Guild R wrote: Most Recent Primary Care Visit:  Provider: Deneise Lever  Department: [facility]  Visit Type: OFFICE VISIT  Date: 07/04/2022  Medication:  rosuvastatin (CRESTOR) 10 MG tablet  Has the patient contacted their pharmacy? Yes (Agent: If no, request that the patient contact the pharmacy for the refill. If patient does not wish to contact the pharmacy document the reason why and proceed with request.) (Agent: If yes, when and what did the pharmacy advise?)  Is this the correct pharmacy for this prescription? Yes If no, delete pharmacy and type the correct one.  This is the patient's preferred pharmacy:  [id number] - [place], [place] - [address] AT [facility] [address] [place] [address] Phone: [phone] Fax: [phone]   Has the prescription been filled recently? Yes  Is the patient out of the medication? Yes  Has the patient been seen for an appointment in the last year OR does the patient have an upcoming appointment? Yes  Can we respond through MyChart? Yes  Agent: Please be advised that Rx refills may take up to 3 business days. We ask that you follow-up with your pharmacy.

## 2023-10-13 ENCOUNTER — Ambulatory Visit (INDEPENDENT_AMBULATORY_CARE_PROVIDER_SITE_OTHER): Payer: BC Managed Care – PPO | Admitting: Physician Assistant

## 2023-10-13 ENCOUNTER — Encounter: Payer: Self-pay | Admitting: Physician Assistant

## 2023-10-13 VITALS — BP 122/80 | HR 92 | Temp 98.7°F | Ht 70.0 in | Wt 206.4 lb

## 2023-10-13 DIAGNOSIS — E1169 Type 2 diabetes mellitus with other specified complication: Secondary | ICD-10-CM | POA: Diagnosis not present

## 2023-10-13 DIAGNOSIS — E785 Hyperlipidemia, unspecified: Secondary | ICD-10-CM

## 2023-10-13 DIAGNOSIS — F411 Generalized anxiety disorder: Secondary | ICD-10-CM | POA: Diagnosis not present

## 2023-10-13 DIAGNOSIS — E119 Type 2 diabetes mellitus without complications: Secondary | ICD-10-CM

## 2023-10-13 DIAGNOSIS — Z7984 Long term (current) use of oral hypoglycemic drugs: Secondary | ICD-10-CM | POA: Diagnosis not present

## 2023-10-13 MED ORDER — METFORMIN HCL 500 MG PO TABS: 1000.0000 mg | ORAL_TABLET | Freq: Two times a day (BID) | ORAL | 3 refills | Status: DC

## 2023-10-13 MED ORDER — ESCITALOPRAM OXALATE 10 MG PO TABS: 10.0000 mg | ORAL_TABLET | Freq: Every day | ORAL | 3 refills | Status: AC

## 2023-10-13 MED ORDER — METFORMIN HCL 500 MG PO TABS: 1000.0000 mg | ORAL_TABLET | Freq: Two times a day (BID) | ORAL | 0 refills | Status: DC

## 2023-10-13 MED ORDER — ESCITALOPRAM OXALATE 10 MG PO TABS: 10.0000 mg | ORAL_TABLET | Freq: Every day | ORAL | 0 refills | Status: DC

## 2023-10-13 MED ORDER — ROSUVASTATIN CALCIUM 10 MG PO TABS: 10.0000 mg | ORAL_TABLET | Freq: Every day | ORAL | 3 refills | Status: DC

## 2023-10-13 NOTE — Assessment & Plan Note (Addendum)
Stable.  Continue current management.  Patient is on statin. Lipid panel today. A1c today.  Increase dietary efforts and physical activity. Metformin refilled for 1 year today.  Foot exam/monofilament and urine ACR at next visit.

## 2023-10-13 NOTE — Assessment & Plan Note (Signed)
Stable. Continue with current management without changes. Discussed healthy diet and lifestyle. Lipid panel today.

## 2023-10-13 NOTE — Assessment & Plan Note (Signed)
Stable. Continue current treatment regimen. Medication refilled for 1 year today.

## 2023-10-13 NOTE — Progress Notes (Cosign Needed)
Established Patient Office Visit  Subjective   Patient ID: Jerry Garcia, male    DOB: 1971/04/10  Age: 53 y.o. MRN: [id number]  No chief complaint on file.   HPI Patient presents today for medication refill. Patient with past medical history of diabetes type 2, hyperlipidemia, and anxiety. Patient states last physical was October 2024 at an urgent care center. He states at this time A1c was elevated. Patient reports daily compliance with medications but states he is only taking 1000mg  of Metformin daily instead of the prescribed 2000mg . Patient reports good efforts towards balanced diet and avoidance of carbs and sweets. He reports active job but does not exercise outside of work. Patient with no complaints or concerns today.   Review of Systems  Constitutional:  Negative for chills, fever and weight loss.  Eyes:  Negative for blurred vision and double vision.  Respiratory:  Negative for cough and shortness of breath.   Cardiovascular:  Negative for chest pain and palpitations.  Gastrointestinal:  Negative for abdominal pain and nausea.  Neurological:  Negative for headaches.  Psychiatric/Behavioral:  Negative for depression. The patient is not nervous/anxious.       Objective:     BP 122/80   Pulse 92   Temp 98.7 F (37.1 C)   Ht 5\' 10"  (1.778 m)   Wt 206 lb 6.4 oz (93.6 kg)   SpO2 98%   BMI 29.62 kg/m    Physical Exam Constitutional:      Appearance: Normal appearance. He is obese.  HENT:     Head: Normocephalic.     Mouth/Throat:     Mouth: Mucous membranes are moist.     Pharynx: Oropharynx is clear.  Eyes:     Extraocular Movements: Extraocular movements intact.     Conjunctiva/sclera: Conjunctivae normal.  Cardiovascular:     Rate and Rhythm: Normal rate and regular rhythm.     Pulses: Normal pulses.     Heart sounds: Normal heart sounds. No murmur heard.    No gallop.  Pulmonary:     Effort: Pulmonary effort is normal.     Breath sounds: No wheezing,  rhonchi or rales.  Musculoskeletal:     Right lower leg: No edema.     Left lower leg: No edema.  Skin:    General: Skin is warm and dry.  Neurological:     General: No focal deficit present.     Mental Status: He is alert and oriented to person, place, and time.  Psychiatric:        Mood and Affect: Mood normal.        Behavior: Behavior normal.      No results found for any visits on 10/13/23.  The 10-year ASCVD risk score (Arnett DK, et al., 2019) is: 8.6%    Assessment & Plan:   Return in about 3 months (around 01/10/2024) for chronic medical f/u.   Type 2 diabetes mellitus without complication, without long-term current use of insulin (HCC) Assessment & Plan: Stable.  Continue current management.  Patient is on statin. Lipid panel today. A1c today.  Increase dietary efforts and physical activity. Metformin refilled for 1 year today.  Foot exam/monofilament and urine ACR at next visit.   Orders: -     Lipid panel -     CMP14+EGFR -     Hemoglobin A1c -     Rosuvastatin Calcium; Take 1 tablet (10 mg total) by mouth daily.  Dispense: 90 tablet; Refill: 3 -  metFORMIN HCl; Take 2 tablets (1,000 mg total) by mouth 2 (two) times daily with a meal.  Dispense: 360 tablet; Refill: 3  Generalized anxiety disorder Assessment & Plan: Stable. Continue current treatment regimen. Medication refilled for 1 year today.  Orders: -     Escitalopram Oxalate; Take 1 tablet (10 mg total) by mouth daily.  Dispense: 90 tablet; Refill: 3  Hyperlipidemia, unspecified hyperlipidemia type Assessment & Plan: Stable. Continue with current management without changes. Discussed healthy diet and lifestyle. Lipid panel today.   Orders: -     Lipid panel -     CMP14+EGFR -     Rosuvastatin Calcium; Take 1 tablet (10 mg total) by mouth daily.  Dispense: 90 tablet; Refill: 3    Anakaren Campion, PA-C

## 2023-10-14 ENCOUNTER — Other Ambulatory Visit: Payer: Self-pay | Admitting: Physician Assistant

## 2023-10-14 DIAGNOSIS — E785 Hyperlipidemia, unspecified: Secondary | ICD-10-CM

## 2023-10-14 DIAGNOSIS — E119 Type 2 diabetes mellitus without complications: Secondary | ICD-10-CM

## 2023-10-14 LAB — CMP14+EGFR
ALT: 29 [IU]/L (ref 0–44)
AST: 24 [IU]/L (ref 0–40)
Albumin: 4.5 g/dL (ref 3.8–4.9)
Alkaline Phosphatase: 93 [IU]/L (ref 44–121)
BUN/Creatinine Ratio: 19 (ref 9–20)
BUN: 19 mg/dL (ref 6–24)
Bilirubin Total: 0.4 mg/dL (ref 0.0–1.2)
CO2: 21 mmol/L (ref 20–29)
Calcium: 9.4 mg/dL (ref 8.7–10.2)
Chloride: 100 mmol/L (ref 96–106)
Creatinine, Ser: 0.99 mg/dL (ref 0.76–1.27)
Globulin, Total: 2.7 g/dL (ref 1.5–4.5)
Glucose: 175 mg/dL — ABNORMAL HIGH (ref 70–99)
Potassium: 4.7 mmol/L (ref 3.5–5.2)
Sodium: 139 mmol/L (ref 134–144)
Total Protein: 7.2 g/dL (ref 6.0–8.5)
eGFR: 91 mL/min/{1.73_m2} (ref >59)

## 2023-10-14 LAB — LIPID PANEL
Chol/HDL Ratio: 3.6 {ratio} (ref 0.0–5.0)
Cholesterol, Total: 221 mg/dL — ABNORMAL HIGH (ref 100–199)
HDL: 61 mg/dL (ref >39)
LDL Chol Calc (NIH): 141 mg/dL — ABNORMAL HIGH (ref 0–99)
Triglycerides: 107 mg/dL (ref 0–149)
VLDL Cholesterol Cal: 19 mg/dL (ref 5–40)

## 2023-10-14 LAB — HEMOGLOBIN A1C
Est. average glucose Bld gHb Est-mCnc: 249 mg/dL
Hgb A1c MFr Bld: 10.3 % — ABNORMAL HIGH (ref 4.8–5.6)

## 2023-10-14 MED ORDER — ROSUVASTATIN CALCIUM 20 MG PO TABS: 20.0000 mg | ORAL_TABLET | Freq: Every day | ORAL | 3 refills | Status: AC

## 2023-11-07 ENCOUNTER — Encounter: Payer: Self-pay | Admitting: Physician Assistant

## 2023-11-07 ENCOUNTER — Ambulatory Visit: Payer: BC Managed Care – PPO | Admitting: Physician Assistant

## 2023-11-07 VITALS — BP 135/83 | HR 95 | Temp 98.4°F | Ht 70.0 in | Wt 207.0 lb

## 2023-11-07 DIAGNOSIS — E119 Type 2 diabetes mellitus without complications: Secondary | ICD-10-CM

## 2023-11-07 DIAGNOSIS — Z7984 Long term (current) use of oral hypoglycemic drugs: Secondary | ICD-10-CM

## 2023-11-07 MED ORDER — EMPAGLIFLOZIN 10 MG PO TABS: 10.0000 mg | ORAL_TABLET | Freq: Every day | ORAL | 2 refills | Status: AC

## 2023-11-07 MED ORDER — METFORMIN HCL 500 MG PO TABS
1000.0000 mg | ORAL_TABLET | Freq: Two times a day (BID) | ORAL | 3 refills | Status: AC
Start: 2023-11-07 — End: ?

## 2023-11-07 NOTE — Progress Notes (Signed)
 Established Patient Office Visit  Subjective   Patient ID: Jerry Garcia, male    DOB: April 10, 1971  Age: 53 y.o. MRN: [id number]  Chief Complaint  Patient presents with   Diabetes    Has started back taking metformin as indicated instead of half the rec daily dose     Patient presents today for follow-up on diabetes.  Previous A1c was 10.3.  At that time patient reports he was not taking metformin correctly and only taking about half of his prescribed dose.  Today patient reports being compliant with metformin twice daily.  He states he has worked on dietary changes to include less sweets and sugary intake, decreasing his sugary beverages.  He states little exercise outside of work where he is on his feet and walking all day.  He reports no symptomology related to diabetes.  He has no concerns or complaints today.     Review of Systems  Constitutional:  Negative for chills and fever.  Eyes:  Negative for blurred vision and double vision.  Respiratory:  Negative for cough.   Cardiovascular:  Negative for chest pain.  Gastrointestinal:  Negative for nausea and vomiting.  Genitourinary:  Negative for dysuria.  Neurological:  Negative for headaches.      Objective:     BP 135/83   Pulse 95   Temp 98.4 F (36.9 C)   Ht 5\' 10"  (1.778 m)   Wt 207 lb (93.9 kg)   SpO2 97%   BMI 29.70 kg/m    Physical Exam Constitutional:      Appearance: Normal appearance.  HENT:     Head: Normocephalic.     Mouth/Throat:     Mouth: Mucous membranes are moist.     Pharynx: Oropharynx is clear.  Eyes:     Extraocular Movements: Extraocular movements intact.     Conjunctiva/sclera: Conjunctivae normal.  Cardiovascular:     Rate and Rhythm: Normal rate and regular rhythm.     Heart sounds: No murmur heard.    No gallop.  Pulmonary:     Effort: Pulmonary effort is normal.     Breath sounds: No wheezing, rhonchi or rales.  Musculoskeletal:     Right lower leg: No edema.     Left  lower leg: No edema.  Skin:    General: Skin is warm and dry.  Neurological:     General: No focal deficit present.     Mental Status: He is alert and oriented to person, place, and time.  Psychiatric:        Mood and Affect: Mood normal.        Behavior: Behavior normal.      No results found for any visits on 11/07/23.  The 10-year ASCVD risk score (Arnett DK, et al., 2019) is: 9.9%    Assessment & Plan:   Return for previously scheduled appt in may- a1c due at that time.   Type 2 diabetes mellitus without complication, without long-term current use of insulin (HCC) Assessment & Plan: Not Controlled. Hyperglycemia. Refilled metformin 1000mg  BID. Starting Jardiance 10mg  daily.    Patient is on statin. Lipid panel checked less than a year ago. Microalbumin- due at next visit .  Continue dietary efforts and physical activity. Routine diabetic retinopathy screening: advised annual appts with ophthalmology. Foot exam and monofilament: due at next visit.    Orders: -     Amb Referral to Nutrition and Diabetic Education -     Empagliflozin; Take 1 tablet (10  mg total) by mouth daily before breakfast.  Dispense: 30 tablet; Refill: 2 -     metFORMIN HCl; Take 2 tablets (1,000 mg total) by mouth 2 (two) times daily with a meal.  Dispense: 360 tablet; Refill: 3    Chardonay Scritchfield, PA-C

## 2023-11-07 NOTE — Assessment & Plan Note (Addendum)
 Not Controlled. Hyperglycemia. Refilled metformin 1000mg  BID. Starting Jardiance 10mg  daily.    Patient is on statin. Lipid panel checked less than a year ago. Microalbumin- due at next visit .  Continue dietary efforts and physical activity. Routine diabetic retinopathy screening: advised annual appts with ophthalmology. Foot exam and monofilament: due at next visit.

## 2023-12-11 ENCOUNTER — Encounter: Payer: Self-pay | Admitting: Nutrition

## 2023-12-11 ENCOUNTER — Encounter: Attending: Physician Assistant | Admitting: Nutrition

## 2023-12-11 VITALS — Ht 70.0 in | Wt 206.0 lb

## 2023-12-11 DIAGNOSIS — E119 Type 2 diabetes mellitus without complications: Secondary | ICD-10-CM | POA: Diagnosis not present

## 2023-12-11 NOTE — Progress Notes (Signed)
 Medical Nutrition Therapy  Appointment Start time:  1300  Appointment End time:  1400  Primary concerns today: Type 2 Dm  Referral diagnosis: E11.8 Preferred learning style: No Preference  Learning readiness: Ready )   NUTRITION ASSESSMENT  53 yr old wmale referred for uncontrolled Type 2 DM. A1C 10.3%. PCP Toni Amend Grooms Recenttly cut out sodas and only drinking water Taking Metformin  1000 mg BID and Jardiance Not testing blood sugars. No exercising. Has hyperlipidemia-On cholesterol medication He is willing to work with Lifestyle Medicine to improve his health and reduce or reverse his DM, Hyperlipidemia.   Wt Readings from Last 3 Encounters:  12/11/23 206 lb (93.4 kg)  11/07/23 207 lb (93.9 kg)  10/13/23 206 lb 6.4 oz (93.6 kg)   Ht Readings from Last 3 Encounters:  12/11/23 5\' 10"  (1.778 m)  11/07/23 5\' 10"  (1.778 m)  10/13/23 5\' 10"  (1.778 m)   Body mass index is 29.56 kg/m. @BMIFA @ Facility age limit for growth %iles is 20 years. Facility age limit for growth %iles is 20 years.   Clinical Medical Hx:  Past Medical History:  Diagnosis Date   Anxiety    DM type 2 (diabetes mellitus, type 2) (HCC)    GERD (gastroesophageal reflux disease)    no meds   History of kidney stones    Ulcerative colitis last flare  may  2021    Medications:  Current Outpatient Medications on File Prior to Visit  Medication Sig Dispense Refill   ACCU-CHEK GUIDE test strip TEST ONCE DAILY AS DIRECTED 100 strip 0   blood glucose meter kit and supplies KIT Dispense based on patient and insurance preference. Tests once daily as directed. (FOR ICD-10- E11.9) 1 each 0   empagliflozin (JARDIANCE) 10 MG TABS tablet Take 1 tablet (10 mg total) by mouth daily before breakfast. 30 tablet 2   escitalopram (LEXAPRO) 10 MG tablet Take 1 tablet (10 mg total) by mouth daily. 90 tablet 3   metFORMIN (GLUCOPHAGE) 500 MG tablet Take 2 tablets (1,000 mg total) by mouth 2 (two) times daily with a  meal. 360 tablet 3   rosuvastatin (CRESTOR) 20 MG tablet Take 1 tablet (20 mg total) by mouth daily. 90 tablet 3   No current facility-administered medications on file prior to visit.    Labs:  Lab Results  Component Value Date   HGBA1C 10.3 (H) 10/13/2023      Latest Ref Rng & Units 10/13/2023   10:16 AM 07/04/2022    2:48 PM 01/01/2021    8:59 AM  CMP  Glucose 70 - 99 mg/dL 782  956  213   BUN 6 - 24 mg/dL 19  20  22    Creatinine 0.76 - 1.27 mg/dL 0.86  5.78  4.69   Sodium 134 - 144 mmol/L 139  144  140   Potassium 3.5 - 5.2 mmol/L 4.7  5.1  4.7   Chloride 96 - 106 mmol/L 100  102  100   CO2 20 - 29 mmol/L 21  23  19    Calcium 8.7 - 10.2 mg/dL 9.4  62.9  9.6   Total Protein 6.0 - 8.5 g/dL 7.2  7.5  7.9   Total Bilirubin 0.0 - 1.2 mg/dL 0.4  0.6  0.4   Alkaline Phos 44 - 121 IU/L 93  92  101   AST 0 - 40 IU/L 24  25  24    ALT 0 - 44 IU/L 29  27  31  Notable Signs/Symptoms: Increased urination, Increased thirst, headaches.  Lifestyle & Dietary Hx LIves alone  Estimated daily fluid intake: 40 oz Supplements: Sleep:  Stress / self-care:  Current average weekly physical activity: Walks on his job  24-Hr Dietary Recall Eats out mostly. Eats 2 meals per day   Estimated Energy Needs Calories: 1800 Carbohydrate: 200g Protein: 135g Fat: 50g   NUTRITION DIAGNOSIS  NB-1.1 Food and nutrition-related knowledge deficit As related to DIabets Type 2.  As evidenced by A1C 10.3%.   NUTRITION INTERVENTION  Nutrition education (E-1) on the following topics:   Nutrition and Diabetes education provided on My Plate, CHO counting, meal planning, portion sizes, timing of meals, avoiding snacks between meals unless having a low blood sugar, target ranges for A1C and blood sugars, signs/symptoms and treatment of hyper/hypoglycemia, monitoring blood sugars, taking medications as prescribed, benefits of exercising 30 minutes per day and prevention of complications of DM.  Lifestyle  Medicine  - Whole Food, Plant Predominant Nutrition is highly recommended: Eat Plenty of vegetables, Mushrooms, fruits, Legumes, Whole Grains, Nuts, seeds in lieu of processed meats, processed snacks/pastries red meat, poultry, eggs.    -It is better to avoid simple carbohydrates including: Cakes, Sweet Desserts, Ice Cream, Soda (diet and regular), Sweet Tea, Candies, Chips, Cookies, Store Bought Juices, Alcohol in Excess of  1-2 drinks a day, Lemonade,  Artificial Sweeteners, Doughnuts, Coffee Creamers, "Sugar-free" Products, etc, etc.  This is not a complete list.....  Exercise: If you are able: 30 -60 minutes a day ,4 days a week, or 150 minutes a week.  The longer the better.  Combine stretch, strength, and aerobic activities.  If you were told in the past that you have high risk for cardiovascular diseases, you may seek evaluation by your heart doctor prior to initiating moderate to intense exercise programs.   Handouts Provided Include  LIfestyle Medicine Know your numbers Glucose log  Learning Style & Readiness for Change Teaching method utilized: Visual & Auditory  Demonstrated degree of understanding via: Teach Back  Barriers to learning/adherence to lifestyle change: None  Goals Established by Pt Eat three meals per day 45 -60 grams of carbs per meal Focus on more whole plant based foods Cut out fast food and processed foods Prepare more meals at home Walk 30 minutes a day 3 times per week. Get A1C down to 7% Check insurance and get meter and test twice a day   MONITORING & EVALUATION Dietary intake, weekly physical activity, and blood sugars in 1 month.  Next Steps  Patient is to work on meal planning and testing blood sugars.Marland Kitchen

## 2023-12-11 NOTE — Patient Instructions (Signed)
 Goals Established by Pt Eat three meals per day 45 -60 grams of carbs per meal Focus on more whole plant based foods Cut out fast food and processed foods Prepare more meals at home Walk 30 minutes a day 3 times per week. Get A1C down to 7% Check insurance and get meter and test twice a day

## 2024-01-09 ENCOUNTER — Ambulatory Visit: Payer: BC Managed Care – PPO | Admitting: Physician Assistant

## 2024-01-16 ENCOUNTER — Ambulatory Visit: Payer: BC Managed Care – PPO | Admitting: Physician Assistant

## 2024-01-20 ENCOUNTER — Telehealth: Payer: Self-pay | Admitting: Internal Medicine

## 2024-01-20 NOTE — Telephone Encounter (Signed)
 Pt is requesting for a new refill prescription to be sent to his pharmacy. Canasa  1000MG  Preferably the [facility] pharmacy on [facility]

## 2024-01-20 NOTE — Telephone Encounter (Signed)
 Patient has not been seen in 4 years - they need an office visit first

## 2024-01-22 ENCOUNTER — Ambulatory Visit: Admitting: Nutrition

## 2024-01-22 NOTE — Telephone Encounter (Signed)
 Patient has not been seen in 4 years.  He now has an appointment scheduled for 7/11 but wants a refill of Canasa  between now and then.  Please advise.

## 2024-01-22 NOTE — Telephone Encounter (Signed)
 Patient called and stated that he is needing a refill on Canasa  1000MG . Patient was scheduled for July 11 th. Please advise.

## 2024-01-25 NOTE — Telephone Encounter (Signed)
 Ok to refill. He needs to keep his appointment. Thanks

## 2024-01-26 MED ORDER — MESALAMINE 1000 MG RE SUPP: 1000.0000 mg | Freq: Two times a day (BID) | RECTAL | 4 refills | Status: DC

## 2024-01-26 NOTE — Telephone Encounter (Signed)
Canasa refilled

## 2024-01-26 NOTE — Addendum Note (Signed)
 Addended by: Reah Justo K on: 01/26/2024 11:06 AM   Modules accepted: Orders

## 2024-02-05 ENCOUNTER — Other Ambulatory Visit: Payer: Self-pay | Admitting: Internal Medicine

## 2024-02-20 ENCOUNTER — Ambulatory Visit: Admitting: Physician Assistant

## 2024-03-01 ENCOUNTER — Ambulatory Visit: Admitting: Nutrition

## 2024-03-19 ENCOUNTER — Ambulatory Visit: Admitting: Internal Medicine

## 2024-03-19 ENCOUNTER — Encounter: Payer: Self-pay | Admitting: Internal Medicine

## 2024-03-19 VITALS — BP 122/68 | HR 99 | Ht 70.0 in | Wt 199.0 lb

## 2024-03-19 DIAGNOSIS — K4090 Unilateral inguinal hernia, without obstruction or gangrene, not specified as recurrent: Secondary | ICD-10-CM | POA: Diagnosis not present

## 2024-03-19 DIAGNOSIS — K5100 Ulcerative (chronic) pancolitis without complications: Secondary | ICD-10-CM

## 2024-03-19 NOTE — Progress Notes (Signed)
 HISTORY OF PRESENT ILLNESS:  Jerry Garcia is a 53 y.o. male with a history of mild intermittent ascending colitis and proctitis.  He was last seen in this office December 22, 2019.  He subsequently underwent complete colonoscopy January 07, 2019.  The examination was normal.  No active colitis.  Internal hemorrhoids noted.  Patient states that he has been doing well.  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  He was having such difficulties about 6 weeks ago.  Bowels were otherwise unchanged (no diarrhea or increased frequency).  He contacted the office requesting a refill of his Canasa  suppositories.  These were refilled and an office appointment made.  Patient tells me that his issues have resolved.  Currently without GI symptoms.  He has noticed a chronic bulge in the left groin region over the past year.  This is not painful but can be a bit uncomfortable.  He asks for assessment.  Review of blood work from October 13, 2023 shows normal comprehensive metabolic panel except for elevated glucose.  Elevated hemoglobin A1c 10.3  REVIEW OF SYSTEMS:  All non-GI ROS negative unless otherwise stated in the HPI. Past Medical History:  Diagnosis Date   Anxiety    DM type 2 (diabetes mellitus, type 2) (HCC)    GERD (gastroesophageal reflux disease)    no meds   History of kidney stones    Ulcerative colitis last flare  may  2021    Past Surgical History:  Procedure Laterality Date   COLONOSCOPY  04/30/2013, 02/2020   HERNIA REPAIR  2010   umbilical   INSERTION OF MESH  03/07/2020   Procedure: INSERTION OF MESH;  Surgeon: Vernetta Berg, MD;  Location: [facility];  Service: General;;   NASAL SINUS SURGERY  2004   UMBILICAL HERNIA REPAIR N/A 03/07/2020   Procedure: UMBILICAL HERNIA REPAIR WITH MESH;  Surgeon: Vernetta Berg, MD;  Location: [facility];  Service:  General;  Laterality: N/A;    Social History Jerry Garcia  reports that he has never smoked. He has never used smokeless tobacco. He reports current alcohol use. He reports that he does not use drugs.  family history includes Alzheimer's disease in his father; Cancer in his mother; Diabetes in his mother; Hypertension in his mother; Stomach cancer in his paternal uncle.  No Known Allergies     PHYSICAL EXAMINATION: Vital signs: BP 122/68   Pulse 99   Ht 5' 10 (1.778 m)   Wt 199 lb (90.3 kg)   BMI 28.55 kg/m   Constitutional: generally well-appearing, no acute distress Psychiatric: alert and oriented x3, cooperative Eyes: extraocular movements intact, anicteric, conjunctiva pink Mouth: oral pharynx moist, no lesions Neck: supple no lymphadenopathy Cardiovascular: heart regular rate and rhythm, no murmur Lungs: clear to auscultation bilaterally Abdomen: soft, nontender, nondistended, no obvious ascites, no peritoneal signs, normal bowel sounds, no organomegaly Rectal: Omitted Groin region.  He has a several centimeter bulge in the inguinal region but not in the scrotum.  This is mobile.  He appears to have hernia. Extremities: no clubbing, cyanosis, or lower extremity edema bilaterally Skin: no lesions on visible extremities Neuro: No focal deficits.  Cranial nerves intact  ASSESSMENT:  1.  History of patchy ascending colitis and proctitis.  Overall doing well. 2.  Recent issues with rectal bleeding.  Presumed flare of proctitis.  Now resolved after several weeks of  Canasa  therapy 3.  Probable left inguinal hernia (versus varicocele). 4.  Colonoscopy April 2021.  Normal   PLAN:  1.  Refill Canasa  suppositories 2.  He will reach out to his PCP regarding surgical evaluation of probable left inguinal hernia (versus varicocele). 3.  Routine office follow-up 2 years.  The patient has been instructed to contact the office in the interim for any questions or clinical  problems. 4.  Routine surveillance colonoscopy around 2031 A total time of 45 minutes was spent preparing to see the patient, obtaining comprehensive history, performing medically appropriate physical examination, counseling and educating the patient regarding the above listed issues, ordering medication, defining follow-up intervals, and documenting clinical information in the health record

## 2024-03-19 NOTE — Patient Instructions (Signed)
 Please follow up with Dr. Abran in 2 years.   _______________________________________________________  If your blood pressure at your visit was 140/90 or greater, please contact your primary care physician to follow up on this.  _______________________________________________________  If you are age 55 or older, your body mass index should be between 23-30. Your Body mass index is 28.55 kg/m. If this is out of the aforementioned range listed, please consider follow up with your Primary Care Provider.  If you are age 34 or younger, your body mass index should be between 19-25. Your Body mass index is 28.55 kg/m. If this is out of the aformentioned range listed, please consider follow up with your Primary Care Provider.   ________________________________________________________  The [facility] providers would like to encourage you to use MYCHART to communicate with providers for non-urgent requests or questions.  Due to long hold times on the telephone, sending your provider a message by [facility] may be a faster and more efficient way to get a response.  Please allow 48 business hours for a response.  Please remember that this is for non-urgent requests.  _______________________________________________________

## 2024-04-02 ENCOUNTER — Encounter: Payer: Self-pay | Admitting: Physician Assistant

## 2024-04-02 ENCOUNTER — Ambulatory Visit: Admitting: Physician Assistant

## 2024-04-02 VITALS — BP 112/75 | HR 84 | Temp 98.2°F | Ht 70.0 in | Wt 200.2 lb

## 2024-04-02 DIAGNOSIS — E119 Type 2 diabetes mellitus without complications: Secondary | ICD-10-CM

## 2024-04-02 DIAGNOSIS — E782 Mixed hyperlipidemia: Secondary | ICD-10-CM | POA: Diagnosis not present

## 2024-04-02 DIAGNOSIS — K4090 Unilateral inguinal hernia, without obstruction or gangrene, not specified as recurrent: Secondary | ICD-10-CM | POA: Diagnosis not present

## 2024-04-02 NOTE — Assessment & Plan Note (Signed)
 Patient presents today with concerns for inguinal hernia. Defers exam today. Relates GI provider examined for him earlier this month and found no alarming symptoms. Referral to general surgery.

## 2024-04-02 NOTE — Progress Notes (Signed)
 Established Patient Office Visit  Subjective   Patient ID: Jerry Garcia, male    DOB: [birth year]  Age: 53 y.o. MRN: [id number]  No chief complaint on file.   Patient presents today for follow up regarding diabetes. Currently on jardiance  and metformin  and states daily compliance with medications. On a statin. No hypoglycemic events. Not checking blood glucose at home. Patient denies complaints of foot pain or paresthesias. Has not been seen by ophthalmologist <1 yr ago. Following a low carb diet, has decreased sugary beverage intake significantly. Exercising. Patient reports concerns for inguinal hernia today, defers GU exam, but requests referral to general surgery. States GI provider examined area of concern earlier this month.       Review of Systems  Constitutional:  Negative for chills, fever and malaise/fatigue.  Eyes:  Negative for blurred vision and double vision.  Respiratory:  Negative for cough and shortness of breath.   Cardiovascular:  Negative for chest pain and palpitations.  Musculoskeletal:  Negative for joint pain and myalgias.  Neurological:  Negative for dizziness and headaches.  Psychiatric/Behavioral:  Negative for depression. The patient is not nervous/anxious.       Objective:     BP 112/75   Pulse 84   Temp 98.2 F (36.8 C)   Ht 5' 10 (1.778 m)   Wt 200 lb 3.2 oz (90.8 kg)   SpO2 97%   BMI 28.73 kg/m    Physical Exam Constitutional:      Appearance: Normal appearance.  HENT:     Head: Normocephalic.     Mouth/Throat:     Mouth: Mucous membranes are moist.     Pharynx: Oropharynx is clear.  Eyes:     Extraocular Movements: Extraocular movements intact.     Conjunctiva/sclera: Conjunctivae normal.  Cardiovascular:     Rate and Rhythm: Normal rate and regular rhythm.     Heart sounds: Normal heart sounds. No murmur heard. Pulmonary:     Effort: Pulmonary effort is normal.     Breath sounds: Normal breath sounds. No wheezing, rhonchi  or rales.  Musculoskeletal:     Right lower leg: No edema.     Left lower leg: No edema.  Skin:    General: Skin is warm and dry.  Neurological:     General: No focal deficit present.     Mental Status: He is alert and oriented to person, place, and time.  Psychiatric:        Mood and Affect: Mood normal. Affect is flat.        Behavior: Behavior normal.     No results found for any visits on 04/02/24.  The 10-year ASCVD risk score (Arnett DK, et al., 2019) is: 7.9%    Assessment & Plan:   Return in about 3 months (around 07/03/2024) for physical .   Type 2 diabetes mellitus without complication, without long-term current use of insulin (HCC) Assessment & Plan: Stable, patient has not been checking sugars at home.  Continue current management.  Patient is on a statin. Microalbumin, A1c, and lipid panel today.  Continue dietary efforts and physical activity. Routine diabetic retinopathy screening: overdue, advised yearly eye exams. Foot exam and monofilament test- due at physical in October.    Orders: -     CMP14+EGFR -     CBC with Differential/Platelet -     Hemoglobin A1c -     Microalbumin / creatinine urine ratio  Mixed hyperlipidemia Assessment & Plan: Stable. Continue with current  management may increase Crestor  pending lab work.  Discussed healthy diet and lifestyle.   Orders: -     Lipid panel  Unilateral inguinal hernia without obstruction or gangrene, recurrence not specified Assessment & Plan: Patient presents today with concerns for inguinal hernia. Defers exam today. Relates GI provider examined for him earlier this month and found no alarming symptoms. Referral to general surgery.   Orders: -     Ambulatory referral to General Surgery    Newborn Beyonce Sawatzky, PA-C

## 2024-04-02 NOTE — Assessment & Plan Note (Addendum)
 Stable, patient has not been checking sugars at home.  Continue current management.  Patient is on a statin. Microalbumin, A1c, and lipid panel today.  Continue dietary efforts and physical activity. Routine diabetic retinopathy screening: overdue, advised yearly eye exams. Foot exam and monofilament test- due at physical in October.

## 2024-04-02 NOTE — Assessment & Plan Note (Signed)
 Stable. Continue with current management may increase Crestor  pending lab work.  Discussed healthy diet and lifestyle.

## 2024-04-03 LAB — LIPID PANEL
Chol/HDL Ratio: 1.7 ratio (ref 0.0–5.0)
Cholesterol, Total: 115 mg/dL (ref 100–199)
HDL: 69 mg/dL (ref >39)
LDL Chol Calc (NIH): 35 mg/dL (ref 0–99)
Triglycerides: 42 mg/dL (ref 0–149)
VLDL Cholesterol Cal: 11 mg/dL (ref 5–40)

## 2024-04-03 LAB — MICROALBUMIN / CREATININE URINE RATIO
Creatinine, Urine: 167.5 mg/dL
Microalb/Creat Ratio: 5 mg/g{creat} (ref 0–29)
Microalbumin, Urine: 9.2 ug/mL

## 2024-04-03 LAB — CMP14+EGFR
ALT: 17 IU/L (ref 0–44)
AST: 15 IU/L (ref 0–40)
Albumin: 4.8 g/dL (ref 3.8–4.9)
Alkaline Phosphatase: 71 IU/L (ref 44–121)
BUN/Creatinine Ratio: 14 (ref 9–20)
BUN: 13 mg/dL (ref 6–24)
Bilirubin Total: 0.5 mg/dL (ref 0.0–1.2)
CO2: 25 mmol/L (ref 20–29)
Calcium: 9.5 mg/dL (ref 8.7–10.2)
Chloride: 100 mmol/L (ref 96–106)
Creatinine, Ser: 0.9 mg/dL (ref 0.76–1.27)
Globulin, Total: 2.5 g/dL (ref 1.5–4.5)
Glucose: 103 mg/dL — ABNORMAL HIGH (ref 70–99)
Potassium: 4.4 mmol/L (ref 3.5–5.2)
Sodium: 140 mmol/L (ref 134–144)
Total Protein: 7.3 g/dL (ref 6.0–8.5)
eGFR: 101 mL/min/1.73 (ref >59)

## 2024-04-03 LAB — HEMOGLOBIN A1C
Est. average glucose Bld gHb Est-mCnc: 137 mg/dL
Hgb A1c MFr Bld: 6.4 % — ABNORMAL HIGH (ref 4.8–5.6)

## 2024-04-03 LAB — CBC WITH DIFFERENTIAL/PLATELET
Basophils Absolute: 0.1 x10E3/uL (ref 0.0–0.2)
Basos: 1 %
EOS (ABSOLUTE): 0.3 x10E3/uL (ref 0.0–0.4)
Eos: 3 %
Hematocrit: 44.9 % (ref 37.5–51.0)
Hemoglobin: 14.6 g/dL (ref 13.0–17.7)
Immature Grans (Abs): 0 x10E3/uL (ref 0.0–0.1)
Immature Granulocytes: 0 %
Lymphocytes Absolute: 2.1 x10E3/uL (ref 0.7–3.1)
Lymphs: 24 %
MCH: 28.9 pg (ref 26.6–33.0)
MCHC: 32.5 g/dL (ref 31.5–35.7)
MCV: 89 fL (ref 79–97)
Monocytes Absolute: 0.8 x10E3/uL (ref 0.1–0.9)
Monocytes: 9 %
Neutrophils Absolute: 5.6 x10E3/uL (ref 1.4–7.0)
Neutrophils: 62 %
Platelets: 381 x10E3/uL (ref 150–450)
RBC: 5.05 x10E6/uL (ref 4.14–5.80)
RDW: 13 % (ref 11.6–15.4)
WBC: 8.8 x10E3/uL (ref 3.4–10.8)

## 2024-04-05 ENCOUNTER — Ambulatory Visit: Payer: Self-pay | Admitting: Physician Assistant

## 2024-05-19 ENCOUNTER — Other Ambulatory Visit: Payer: Self-pay | Admitting: Internal Medicine

## 2024-05-20 ENCOUNTER — Other Ambulatory Visit (HOSPITAL_COMMUNITY): Payer: Self-pay

## 2024-05-20 ENCOUNTER — Telehealth: Payer: Self-pay

## 2024-05-20 NOTE — Telephone Encounter (Signed)
 Pharmacy Patient Advocate Encounter   Received notification from RX Request Messages that prior authorization for Mesalamine  1000 MG Suppositories is required/requested.   Insurance verification completed.   The patient is insured through [facility] .   Per test claim: PA required; PA submitted to above mentioned insurance via Fax Key/confirmation #/EOC [employer] Status is pending

## 2024-05-20 NOTE — Telephone Encounter (Signed)
 PA request has been Submitted. New Encounter has been or will be created for follow up. For additional info see Pharmacy Prior Auth telephone encounter from 05-20-2024.

## 2024-05-20 NOTE — Telephone Encounter (Signed)
 Please initiate PA or dose change to approve TWICE a day Canasa  suppositories

## 2024-05-20 NOTE — Telephone Encounter (Signed)
 Pharmacy Patient Advocate Encounter  Received notification from [facility] that Prior Authorization for  Mesalamine  1000 MG Suppositories has been CANCELLED due to see below   PA #/Case ID/Reference #: [id number]

## 2024-05-26 ENCOUNTER — Other Ambulatory Visit: Payer: Self-pay

## 2024-05-26 MED ORDER — MESALAMINE 1000 MG RE SUPP: 1000.0000 mg | Freq: Two times a day (BID) | RECTAL | 1 refills | Status: AC

## 2024-06-04 DIAGNOSIS — K4090 Unilateral inguinal hernia, without obstruction or gangrene, not specified as recurrent: Secondary | ICD-10-CM | POA: Diagnosis not present

## 2024-07-01 DIAGNOSIS — K4090 Unilateral inguinal hernia, without obstruction or gangrene, not specified as recurrent: Secondary | ICD-10-CM | POA: Diagnosis not present

## 2024-07-02 DIAGNOSIS — K4090 Unilateral inguinal hernia, without obstruction or gangrene, not specified as recurrent: Secondary | ICD-10-CM | POA: Diagnosis not present

## 2024-07-09 ENCOUNTER — Ambulatory Visit (INDEPENDENT_AMBULATORY_CARE_PROVIDER_SITE_OTHER): Admitting: Family Medicine

## 2024-07-09 VITALS — BP 112/75 | HR 95 | Temp 98.1°F | Ht 70.0 in | Wt 205.5 lb

## 2024-07-09 DIAGNOSIS — Z0000 Encounter for general adult medical examination without abnormal findings: Secondary | ICD-10-CM | POA: Diagnosis not present

## 2024-07-09 NOTE — Patient Instructions (Signed)
 Call your surgeon.  Follow up in 3 months  Take care  Dr. Bluford

## 2024-07-11 DIAGNOSIS — Z0000 Encounter for general adult medical examination without abnormal findings: Secondary | ICD-10-CM | POA: Insufficient documentation

## 2024-07-11 NOTE — Assessment & Plan Note (Signed)
 Declines immunizations. Declines HIV and Hep C screening. A1c and Lipids well controlled.

## 2024-07-11 NOTE — Progress Notes (Signed)
 Subjective:  Patient ID: Jerry Garcia, male    DOB: 1970-12-05  Age: 53 y.o. MRN: [id number]  CC:   Chief Complaint  Patient presents with   Annual Exam    Patient is here for a physical.  Patient mentioned having an inguinal hernia. Patient just had surgery 8 days ago on (07/01/2024)    HPI:  53 year old male presents for an annual exam.  Had surgery on 10/23 for left inguinal hernia.  He states that he is having pain and feels that his hernia is still there despite surgery.  Has had recent labs. A1c and Lipids are well controlled. BP is normal. Patient declines vaccines. Needs eye exam. Declines HIV and Hep C screening.    Patient Active Problem List   Diagnosis Date Noted   Annual physical exam 07/11/2024   Unilateral inguinal hernia without obstruction or gangrene 04/02/2024   Hyperlipidemia 08/27/2021   Type 2 diabetes mellitus without complication, without long-term current use of insulin (HCC) 02/24/2018   Generalized anxiety disorder 06/26/2014   Ulcerative colitis (HCC) 10/25/2010    Social Hx   Social History   Socioeconomic History   Marital status: Single    Spouse name: Not on file   Number of children: 0   Years of education: Not on file   Highest education level: 12th grade  Occupational History   Occupation: naval architect  Tobacco Use   Smoking status: Never   Smokeless tobacco: Never  Vaping Use   Vaping status: Never Used  Substance and Sexual Activity   Alcohol use: Yes    Comment: social   Drug use: No   Sexual activity: Not on file  Other Topics Concern   Not on file  Social History Narrative   Patient lives at home with mother Jerry Garcia.   Patient is right handed    Patient is single   Patient has no children    Social Drivers of Corporate Investment Banker Strain: Low Risk  (07/09/2024)   Overall Financial Resource Strain (CARDIA)    Difficulty of Paying Living Expenses: Not hard at all  Food Insecurity: No Food  Insecurity (07/09/2024)   Hunger Vital Sign    Worried About Running Out of Food in the Last Year: Never true    Ran Out of Food in the Last Year: Never true  Transportation Needs: No Transportation Needs (07/09/2024)   PRAPARE - Administrator, Civil Service (Medical): No    Lack of Transportation (Non-Medical): No  Physical Activity: Insufficiently Active (07/09/2024)   Exercise Vital Sign    Days of Exercise per Week: 1 day    Minutes of Exercise per Session: 10 min  Stress: No Stress Concern Present (07/09/2024)   Jerry Garcia of Occupational Health - Occupational Stress Questionnaire    Feeling of Stress: Not at all  Social Connections: Socially Isolated (07/09/2024)   Social Connection and Isolation Panel    Frequency of Communication with Friends and Family: Once a week    Frequency of Social Gatherings with Friends and Family: Once a week    Attends Religious Services: Never    Database Administrator or Organizations: No    Attends Engineer, Structural: Not on file    Marital Status: Never married    Review of Systems Per HPI  Objective:  BP 112/75 (BP Location: Right Arm, Patient Position: Sitting)   Pulse 95   Temp 98.1 F (36.7 C)  Ht 5' 10 (1.778 m)   Wt 205 lb 8 oz (93.2 kg)   BMI 29.49 kg/m      07/09/2024    8:57 AM 04/02/2024    4:00 PM 03/19/2024    2:47 PM  BP/Weight  Systolic BP 112 112 122  Diastolic BP 75 75 68  Wt. (Lbs) 205.5 200.2 199  BMI 29.49 kg/m2 28.73 kg/m2 28.55 kg/m2    Physical Exam Vitals and nursing note reviewed.  Constitutional:      General: He is not in acute distress.    Appearance: Normal appearance.  HENT:     Head: Normocephalic and atraumatic.  Eyes:     General:        Right eye: No discharge.        Left eye: No discharge.     Conjunctiva/sclera: Conjunctivae normal.  Cardiovascular:     Rate and Rhythm: Normal rate and regular rhythm.  Pulmonary:     Effort: Pulmonary effort is  normal.     Breath sounds: Normal breath sounds. No wheezing, rhonchi or rales.  Genitourinary:    Comments: No appreciable hernia on exam but he is very tender making it difficult to get and thorough exam. Neurological:     Mental Status: He is alert.  Psychiatric:        Mood and Affect: Mood normal.        Behavior: Behavior normal.     Lab Results  Component Value Date   WBC 8.8 04/02/2024   HGB 14.6 04/02/2024   HCT 44.9 04/02/2024   PLT 381 04/02/2024   GLUCOSE 103 (H) 04/02/2024   CHOL 115 04/02/2024   TRIG 42 04/02/2024   HDL 69 04/02/2024   LDLCALC 35 04/02/2024   ALT 17 04/02/2024   AST 15 04/02/2024   NA 140 04/02/2024   K 4.4 04/02/2024   CL 100 04/02/2024   CREATININE 0.90 04/02/2024   BUN 13 04/02/2024   CO2 25 04/02/2024   TSH 2.690 06/14/2014   HGBA1C 6.4 (H) 04/02/2024     Assessment & Plan:  Annual physical exam Assessment & Plan: Declines immunizations. Declines HIV and Hep C screening. A1c and Lipids well controlled.     Follow-up:  3 months  Tyera Hansley Bluford DO [facility]

## 2024-08-02 DIAGNOSIS — K4090 Unilateral inguinal hernia, without obstruction or gangrene, not specified as recurrent: Secondary | ICD-10-CM | POA: Diagnosis not present

## 2024-08-11 DIAGNOSIS — Z9889 Other specified postprocedural states: Secondary | ICD-10-CM | POA: Diagnosis not present

## 2024-08-11 DIAGNOSIS — Z8719 Personal history of other diseases of the digestive system: Secondary | ICD-10-CM | POA: Diagnosis not present

## 2024-10-08 ENCOUNTER — Ambulatory Visit: Admitting: Family Medicine
